# Patient Record
Sex: Male | Born: 1965 | Race: White | Hispanic: No | Marital: Married | Smoking: Current every day smoker
Health system: Southern US, Community
[De-identification: ages and names within clinical notes are randomized; demographics above are authoritative.]

## PROBLEM LIST (undated history)

## (undated) DIAGNOSIS — E119 Type 2 diabetes mellitus without complications: Secondary | ICD-10-CM

## (undated) DIAGNOSIS — I10 Essential (primary) hypertension: Secondary | ICD-10-CM

## (undated) DIAGNOSIS — E785 Hyperlipidemia, unspecified: Secondary | ICD-10-CM

## (undated) DIAGNOSIS — E039 Hypothyroidism, unspecified: Secondary | ICD-10-CM

## (undated) HISTORY — PX: COMBINED KIDNEY-PANCREAS TRANSPLANT: SHX1382

## (undated) NOTE — Telephone Encounter (Signed)
Formatting of this note might be different from the original.  She is calling to get records on this pt  Electronically signed by Townsend Roger at 05/15/2020  1:18 PM EDT

## (undated) NOTE — Progress Notes (Signed)
Formatting of this note is different from the original.    Assessment and Plan  1. Long-term drug therapy    2. History of renal transplant    3. Pancreas transplant status    4. Mixed hyperlipidemia    5. Kidney replaced by transplant    6. Kidney transplant status    7. Long-term current use of drug therapy    1. Status post kidney transplant with excellent allograft function, although we have not had labs since the end of November.  I encouraged him to do his labs as soon as possible.  2. Long-term immunosuppression.  We need drug levels.  3. Status post pancreas transplant for diabetes mellitus.  He had been doing well.  4. Hypertension, well controlled.  5. Renal osteodystrophy, well controlled.  6. Proteinuria, needs to be evaluated.  7. The patient does not have any current depressive symptoms.  He is looking forward to the move to [place], and he states that he has good support of friends and his boss and he is moving closer to them, and he overall states that he feels well where he is at now despite being sad about the divorce.    No orders of the defined types were placed in this encounter.    Return in 4 months (on 03/11/2017).      History of Present Illness:  Chief Concern:  Status post kidney transplant followed by a pancreas transplant.    Subjective:  Overall, the patient is doing well.  He denies any fevers, chills, or night sweats.  No orthopnea, no PND, no nausea, no emesis, no dysuria, no hematuria, and no admissions to the hospital.  He has not had blood work since November.  He is currently in the process of getting divorced.  We had an extensive discussion about managing his health during the stressful moments.  The patient will do his blood work.  He is also in the middle of planning a move to [place], [place].  He may be targeting a Dr. Rufina Falco for his nephrologist there locally.  There is no transplant center.  The closest one is likely to be [place].  He denies any other concerns.   He states that he has been taking his medications.  He has lost some weight.  I have explained to him that absorption of Prograf is dependent and fluctuates with his diet, so we need to check his labs.    Medications, labs, and vitals were reviewed and updated.    The following portions of the patient's chart were reviewed in this encounter and updated as appropriate: Allergies  Meds  Problems        Review of Systems    Constitutional: Negative for chills and fever.   Respiratory: Negative for cough and shortness of breath.    Cardiovascular: Negative for chest pain, palpitations and leg swelling.   Gastrointestinal: Negative for abdominal pain, nausea and vomiting.   Genitourinary: Negative for dysuria, frequency, hematuria and urgency.     Physical Exam  BP 124/82   Wt 190 lb (86.2 kg)   BMI 25.77 kg/m     Vitals reviewed.  Constitutional: He is oriented to person, place, and time. No distress.   HENT:   Head: Normocephalic and atraumatic.   Mouth/Throat: Oropharynx is clear and moist.   Eyes: Conjunctivae are normal. Pupils are equal, round, and reactive to light. No scleral icterus.   Neck: Normal range of motion. Neck supple. No thyromegaly present.  Cardiovascular: Normal rate, regular rhythm and normal heart sounds.  He exhibits no edema.   Pulmonary/Chest: Effort normal and breath sounds normal. No respiratory distress.   Abdominal: Soft. Bowel sounds are normal. He exhibits no mass. There is no tenderness.   Neurological: He is alert and oriented to person, place, and time.   Skin: Skin is warm and dry.         Laverna Peace, MD    SS/dg    D:  11/11/2016  T:  11/13/2016      Electronically signed by Rosilyn Mings at 11/14/2016  1:55 PM EST

---

## 2010-07-18 NOTE — Discharge Summary (Signed)
SURGICAL DISCHARGE SUMMARY  Admission Date: 06/25/2010     Attending Physician: No att. 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 He also had removal of his peritoneal dialysis catheter at that time.  Kerry Mercado had an uneventful post-operative course with excellent function in the renal transplant, stable hemoglobin, prompt resolution of his post-operative ileus and satisfactory cardiopulmonary function.  He did complain of paresthesias in both hands on post-operative day 1 but the paresthesias improved steadily and were gone by the time of his hospital discharge.  Kerry Mercado received simulect induction immunosuppression and then a combination of tacrolimus, cellcept and prednisone for maintenance immunosuppression.  He agreed to participate in a drug study of extended release tacrolimus and this was administered via Pharmacy.  His tacrolimus levels were initially quite high, the dose was reduced and he achieved therapeutic tacrolimus levels by the time of hospital discharge.   Kerry Mercado's diabetes was controlled with an insulin pump prior to his transplant.  The pump was removed for the first few days post-operatively and his blood sugars were managed with lantus insulin and regular insulin sliding scale.  [name] did have high blood sugars for several days but blood sugars improved significantly with modification of his insulin regimen.  Kerry Mercado began using his insulin  pump again one day prior to hospital discharge.  Kerry Mercado was carefully instructed in his medications, his home regimen including accucheks and vital sign measurement and his anticipated blood testing and clinic visit schedule.  Kerry Mercado and his Kerry Mercado were both CMV negative and he received valtrex as CMV prophylaxis.     Discharged Condition:   Doing well postoperatively.   Pain is well controlled.  He is ambulating well. He is tolerating a normal diet.     Disposition: Home    Patient Instructions:   Activity:  activity as tolerated  Assistive Devices:  None  Diet:  Diabetic Diet  Wound Care: As directed  Follow-up: Transplant Office on 07/03/2010  Outpatient Tests:    Labs: CBC w/diff, renal function panel, tacrolimus level in 3 days.    Discharge Medications:  Discharge Medication List as of 06/30/10 02:31 PM      START taking these medications       INV PROGRAF/Placebo    1 Cap, Oral, EVERY 12 HOURS Starting 06/30/2010, Until Discontinued, Normal, Disp-1 Each, R-0        INV TACRO/PLACEBO    3 Tab, Oral, EVERY 24 HOURS Starting 06/30/2010, Until Discontinued, Phone In, Disp-1 Each, R-0        mycophenolate 250 MG PO CAPS    1,000 mg, Oral, 2 TIMES DAILY Starting 06/30/2010, Until Discontinued, Phone In, Disp-1 Cap, R-0        predniSONE 20 MG PO TABS    Use as directed. 40 mg, Oral, DAILY Starting 06/30/2010, Until Discontinued, Phone In, Disp-1 Tab, R-0        sulfamethoxazole-trimethoprim (BACTRIM) 400-80 MG PO TABS    1 Tab, Oral,  EVERY MO, WE & FR Starting 06/30/2010, Last dose on Tue 07/10/10, Phone In, Disp-20 Tab, R-0        aspirin 81 MG PO CHEW    81 mg, Oral, EVERY MORNING Starting 06/30/2010, Until Discontinued, OTC        valACYclovir (VALTREX) 500 MG PO TABS    500 mg, Oral, 2 TIMES DAILY Starting 06/30/2010, Last dose on Sat 07/07/10, Phone In, Disp-14 Tab, R-0        omeprazole (PRILOSEC) 40 MG PO CAP-DEL-REL    40 mg, Oral, DAILY Starting 06/30/2010, Until Discontinued, Phone In, Disp-90 Cap, R-0         clotrimazole (MYCELEX) 10 MG MT TROC    10 mg, Oral, 3 TIMES DAILY AFTER MEALS Starting 06/30/2010, Until Discontinued, Phone In, Disp-1 Troche, R-0        docusate sodium (COLACE) 100 MG PO CAPS    100 mg, Oral, 2 TIMES DAILY Starting 06/30/2010, Until Discontinued, Phone In, Disp-1 Cap, R-0        amLODIPine 10 MG PO TABS    10 mg, Oral, DAILY Starting 06/30/2010, Until Discontinued, Phone In, Disp-90 Tab, R-0        hydrocodone-acetaminophen (NORCO) 5-325 MG PO TABS    1-2 Tab, Oral, EVERY 4 HOURS PRN Starting 06/30/2010, Until Discontinued, Normal, Disp-1 Tab, R-0        insulin lispro 100 UNIT/ML SC SOLN    Use as directed Subcutaneous, Sample, Disp-1 Cartridge, R-0          CONTINUE these medications which have CHANGED       atorvastatin 80 MG PO TABS    80 mg, Oral, NIGHTLY Starting 06/30/2010, Until Discontinued, Phone In, Disp-90 Tab, R-0        ezetimibe (ZETIA) 10 MG PO TABS    10 mg, Oral, EVERY EVENING Starting 06/30/2010, Until Discontinued, Phone In, Disp-90 Tab, R-0        levothyroxine 75 MCG PO TABS    75 mcg, Oral, EVERY MORNING Starting 06/30/2010, Until Discontinued, Phone In, Disp-90 Tab, R-0        fluoxetine 20 MG PO CAPS    40 mg, Oral, EVERY MORNING Starting 06/30/2010, Until Discontinued, Phone In, Disp-90 Cap, R-0        cloNIDine 0.2 MG PO TABS    0.2 mg, Oral, 3 TIMES DAILY Starting 06/30/2010, Until Discontinued, Phone In, Disp-90 Tab, R-0        hydrALAZINE 50 MG PO TABS    50 mg, Oral, 3 TIMES DAILY Starting 06/30/2010, Until Discontinued, Phone In, Disp-1 Each, R-0          STOP taking these medications       cephALEXin 500 MG PO CAPS    Comments:     Reason for Stopping:         diltiazem CD (TAZTIA XT) 300 MG PO CAP-SR-24HR    Comments:     Reason for Stopping:         calcium acetate 667 MG PO CAPS    Comments:     Reason for Stopping:         Potassium Chloride 10 MEQ PO CAP-CONT-REL    Comments:     Reason for Stopping:         Aspirin 81 MG PO TABS    Comments:     Reason for Stopping:          B Complex-C-Folic Acid (DIALYVITE PO)    Comments:  Reason for Stopping:         famotidine 40 MG PO TABS    Comments:     Reason for Stopping:         furosemide (LASIX) 40 MG PO TABS    Comments:     Reason for Stopping:         ondansetron (ZOFRAN) 4 MG PO TABS    Comments:     Reason for Stopping:                   Signed:  Georgeann Oppenheim, MD,  07/18/2010, 7:54 PM

## 2010-08-17 NOTE — Consults (Signed)
Renal-Hypertension Consult/Initial Hospital Visit  Assessment:  Acute gastroenteritis.  Differential included viral illness, food poisoning, or gastroparesis.  There is no definite sign of peritonitis, but with immunosuppression need to watch closely.  IDDM.  He is on an insulin pump.  I will continue this for now, and have him give boluses based on accuchecks.  Check glucose every 4 hrs for now.  HTN.  Likely exacerbated now due to acute illness, wretching, and withdrawal of clonidine (because of N/V unable to keep it down.)  Immunosuppression, s/p renal transplant.  IS drugs ordered by Renal Surgery.  Will follow levels.    Plan:  Insulin pump.  Accucheck q 4 hrs.  Patient to give boluses based on his SS for now.  Clonidine patch.  Resume oral meds when able.  Consider adding benzodiazepine for N/V if does not improve with Reglan and Zofran.    HPI: Kerry Mercado is a 44 y.o. male who had a renal transplant 7 weeks ago.  He has been doing well since then.  Yesterday afternoon he began feeling weak and tired.  He developed nausea and today started vomiting continuously.  He started diarrhea today also.  He has had some sweats but no fever.  He does not have much abdominal pain, except some strain when he wretches.  He recalls having [employer] yesterday before getting sick, 2 chicken sandwiches and french fries.  There is a history of AM nausea for a year before the transplant, but not much overt gastroparesis or other GI history.  He did have a sore throat yesterday.  Because of the refractory N/V, he is admitted now for IVF and antiemetics.    Kerry Mercado has diabetes, and uses an insulin pump at home.  His pump delivers approximately 1.5 units/hr, and he gives boluses based on accuchecks.  Meds reviewed.    PMH: Past Medical History   Diagnosis Date    HTN (hypertension)     Diabetic      insulin dependent     Diabetic retinopathy     Depression     CRF (chronic renal failure)     CAPD (continuous ambulatory  peritoneal dialysis) status     GERD (gastroesophageal reflux disease)     Hypothyroid     History of renal transplant        Past Surgical History   Procedure Date    Laser surgery of eye     Other surgical history      CAPD insertion    Dialysis fistula creation      left arm (never used)    Kidney transplant 06/25/2010     KIDNEY TRANSPLANT RECIPIENT LIVING DONOR performed by KETEL, BEVERLEY L at [facility]         Medications:    Current hospital medications: 0.9 % sodium chloride solution ;  ondansetron (ZOFRAN) injection 4 mg;  amLODIPine (NORVASC) tablet 10 mg;  aspirin chewable tablet 81 mg;  atorvastatin (LIPITOR) tablet 40 mg;  cloNIDine (CATAPRES) tablet 0.1 mg;  ezetimibe (ZETIA) tablet 10 mg;  fluoxetine (PROZAC) capsule 40 mg;  hydrocodone-acetaminophen (NORCO) 5-325 MG per tablet 1-2 Tab;  levothyroxine (SYNTHROID) tablet 75 mcg  omeprazole (PRILOSEC) capsule 40 mg;  predniSONE (DELTASONE) tablet 5 mg;  sulfamethoxazole-trimethoprim (BACTRIM, SEPTRA) 400-80 MG per tablet 1 Tab;  valACYclovir (VALTREX) tablet 500 mg;  LABETALOL HCL 5 MG/ML IV SOLN;  metoclopramide (REGLAN) injection 10 mg;  mycophenolate (CELLCEPT) 750 mg in dextrose 5 % 140 mL IVPB;  AMINOPHYLLINE  25 MG/ML IV SOLN    Allergies: Shellfish allergy    FH: family history is not on file.    SH:  reports that he has never smoked. His smokeless tobacco use includes chew.  He reports that he drinks alcohol.  He reports that he uses illicit drugs (Marijuana) about twice per week.    ROS: Review of Systems -   History obtained from the patient  General ROS: negative  Respiratory ROS: no cough, shortness of breath, or wheezing  Cardiovascular ROS: no chest pain or dyspnea on exertion  Gastrointestinal ROS: see HPI  Genito-Urinary ROS: no dysuria, trouble voiding, or hematuria  Musculoskeletal ROS: negative       Physical Examination:  BP 212/0  Pulse 95  Temp 97.3 F (36.3 C)  Resp 32  Ht 6' (1.829 m)  Wt 159 lb 9 oz (72.377 kg)   SpO2 100%    General appearance: alert, moderate distress, cooperative, appears stated age  Head: Normocephalic, without obvious abnormality, atraumatic  Eyes: conjunctivae/corneas clear. Pupils equal, round, reactive to light. Extraocular Movements intact.  Throat: Lips, mucosa, and tongue normal. Teeth and gums normal  Neck: supple, symmetrical, trachea midline, no adenopathy, thyroid: not enlarged, symmetric, no tenderness/mass/nodules and no Jugular Venous Distention  Lungs: clear to auscultation bilaterally  Heart: regular rate and rhythm, S1, S2 normal, no rub  Abdomen: BS present.  Soft.  No definite tenderness.  He does wretch repeatedly during the exam.  Extremities: extremities normal, atraumatic, no cyanosis or edema  Pulses: 2+ and symmetric  Skin: Skin color, texture, turgor normal. No rashes or lesions  Neurologic:   Alert and oriented X 3.  Normal strength and tone.  Grossly normal      Labs: Lab Results   Component Value Date    WBC 11.5 08/17/10    HEMOGLOBIN 14.3 08/17/10    HEMATOCRIT 42.2 08/17/10    PLATELETCNT 237 08/17/10    MCV 92.5 08/17/10       Lab Results   Component Value Date    SODIUM 144 08/17/10    POTASSIUM 4.4 08/17/10    CHLORIDE 104 08/17/10    CO2VEN 26 08/17/10    GLUCOSE 282 08/17/10    BUN 22 08/17/10    CREATININE 1.38 08/17/10    CALCIUM 10.1 08/17/10    MAGNESIUM 1.4 08/17/10    PHOSPHORUS 3.1 08/17/10    ALBUMIN 4.7 08/17/10       Lab Results   Component Value Date    MAGNESIUM 1.4 08/17/10           Cleatrice Burke, MD  08/17/2010, 3:16 PM

## 2010-08-18 NOTE — Progress Notes (Signed)
Renal Progress Note    Assessment:   Acute gastroenteritis. Differential included viral illness, food poisoning, or gastroparesis. Much better today; back to normal.  I suspect fluids and glucose control have helped.  IDDM. He is on an insulin pump. Would continue at increased basal rate for now.  HTN. Improved.  Will resume home meds, follow bp's.  Immunosuppression, s/p renal transplant. IS drugs ordered by Renal Surgery. Will follow levels.    Subjective:  Feels much better.  Wants to eat.    Medications reviewed.    Allergies: Shellfish allergy    Physical Examination:  BP 168/68  Pulse 93  Temp(Src) 98.3 F (36.8 C) (Oral)  Resp 18  Ht 6' (1.829 m)  Wt 154 lb 14.4 oz (70.262 kg)  SpO2 97%    Intake/Output Summary (Last 24 hours) at 08/18/10 1156  Last data filed at 08/18/10 2130   Gross per 24 hour   Intake 3432.92 ml   Output   3350 ml   Net  82.92 ml       General appearance: alert, no distress, cooperative, appears stated age  Eyes: negative  Lungs: clear to auscultation bilaterally  Heart: regular rate and rhythm, S1, S2 normal  Abdomen: soft, non-tender. Bowel sounds normal. No masses,  no organomegaly  Extremities: extremities normal, atraumatic, no cyanosis or edema  Neurologic:   Alert and oriented X 3.  Grossly normal        Labs: Lab Results   Component Value Date    WBC 9.0 08/18/10    HEMOGLOBIN 12.7 08/18/10    HEMATOCRIT 37.3 08/18/10    PLATELETCNT 225 08/18/10    MCV 91.6 08/18/10       Lab Results   Component Value Date    SODIUM 142 08/18/10    POTASSIUM 4.3 08/18/10    CHLORIDE 109 08/18/10    CO2VEN 22 08/18/10    GLUCOSE 193 08/18/10    BUN 15 08/18/10    CREATININE 1.10 08/18/10    CALCIUM 9.4 08/18/10    MAGNESIUM 1.4 08/17/10    PHOSPHORUS 3.1 08/18/10    ALBUMIN 3.9 08/18/10       Lab Results   Component Value Date    MAGNESIUM 1.4 08/17/10           Cleatrice Burke, MD  08/18/2010, 11:56 AM

## 2010-08-19 NOTE — Unmapped External Note (Signed)
Problem: General Plan of Care - Adult/OB  Goal: PLAN OF CARE REVIEWED  Plan of Care mutually reviewed/revised with the patient and/or other representative during this shift or as per policy.   Outcome: Ongoing (see interventions/notes)  Plan of Care reviewed with: patient    Patient Progress: No change    Outcome Summary: Pt slept between cares. No complaints voiced.

## 2010-08-19 NOTE — Progress Notes (Signed)
Renal Surgery Follow Up Note      Assessment:   1) Resolved episode of nausea, vomiting   2) hydration status much better   3) blood pressure control improved   4) diabetes adequately controlled   5) good renal transplant function    Plan:   1) discharge home   2) follow-up appointment (previously scheduled) on 08/21/2010       Subjective:  Kerry Mercado is back to normal.  No problems with po intake yesterday.    Objective:  Sitting up in bed, comfortable    General: BP 146/86  Pulse 86  Temp(Src) 98.1 F (36.7 C) (Oral)  Resp 16  Ht 6' (1.829 m)  Wt 158 lb 11.2 oz (71.986 kg)  SpO2 98%   Intake/Output Summary (Last 24 hours) at 08/19/10 0911  Last data filed at 08/19/10 0824   Gross per 24 hour   Intake 4198.33 ml   Output   3575 ml   Net 623.33 ml     HEENT:  Mouth moist, no thrush  CV: Regular rate and rhythm, no murmurs  Lungs: clear to auscultation.  Abdomen: Non-distended, bowel sounds present. The abdomen was soft, non-tender, and without masses, or hepatosplenomegaly.  Renal transplant in RLQ non-tender  Extremities: No  lower extremity edema.  Integument: No skin rashes, or palpable nodules.    Labs: Lab Results   Component Value Date    WBC 10.4 08/19/10    HEMOGLOBIN 12.5 08/19/10    HEMATOCRIT 37.2 08/19/10    PLATELETCNT 206 08/19/10    MCV 91.2 08/19/10         Lab Results   Component Value Date    SODIUM 141 08/19/10    POTASSIUM 4.0 08/19/10    CHLORIDE 108 08/19/10    CO2VEN 24 08/19/10    GLUCOSE 125 08/19/10    BUN 12 08/19/10    CREATININE 1.08 08/19/10    CALCIUM 9.3 08/19/10    MAGNESIUM 1.4 08/17/10    PHOSPHORUS 2.6 08/19/10    ALBUMIN 3.6 08/19/10         Lab Results   Component Value Date    CALCIUM 9.3 08/19/10    PHOSPHORUS 2.6 08/19/10         Lab Results   Component Value Date    MAGNESIUM 1.4 08/17/10             Georgeann Oppenheim, MD  08/19/2010, 9:11 AM

## 2010-08-19 NOTE — Discharge Summary (Signed)
SURGICAL DISCHARGE SUMMARY  Admission Date: 08/17/2010     Attending Physician: Georgeann Oppenheim, MD    Discharge Date:  Primary Care Physician: PAGE Marylen Ponto, MD     Discharge Diagnoses: Patient Active Hospital Problem List:  *Nausea and vomiting in adult, intractable (08/18/2010)  Kidney transplant (06/25/2010)  Diabetes mellitus (06/25/2010)  Hypertension (06/25/2010)  Immunosuppression (07/18/2010)    Summary of History and Hospital Course:  Kerry Mercado is a 44 year old kidney transplant recipient who developed intractable nausea, vomiting with inability to take his oral medications for one day and was admitted for treatment.  He was very hypertensive and hyperglycemic at the time of admission.  He received anti-emetics, IV fluids, nitropaste, dermal clonidine and adjustment of his insulin regimen.  He responded well to the treatment and his symptoms resolved quickly.  He tolerated resumption of his diet and oral medications.  His blood pressure was adequately controlled when his usual blood pressure medicines were resumed.  He continued to have good function in his renal transplant during the hospitalization.  The etiology of his GI problems was not fully diagnosed but was felt to be exacerbation of diabetic gastroparesis.     Discharged Condition:   Improved. He is tolerating a normal diet.     Disposition: Home    Patient Instructions:   Activity:  activity as tolerated  Diet:  Diabetic Diet  Follow-up: Transplant Office on 08/21/2010  Outpatient Tests:    Labs: CBC w/diff, renal function panel, prograf level in 2 days.    Discharge Medications:  Current Discharge Medication List      START taking these medications       !! sulfamethoxazole-trimethoprim (BACTRIM) 400-80 MG PO TABS    Take 1 Tab by mouth Every Monday, Wednesday, Friday.    Qty: 13 Tab Refills: 1        !! - Potential duplicate medications found. Please discuss with provider.      CONTINUE these medications which have NOT CHANGED       other  OTHER    1 Tab by Other route 2 times daily. Research medications, see dosing schedule with patient         cloNIDine 0.1 MG PO TABS    Take 0.1 mg by mouth 3 times daily.        predniSONE 5 MG PO TABS    Take 5 mg by mouth daily. Use as directed.         insulin lispro (HUMALOG) 100 UNIT/ML SC SOLN    1-24 Units by Subcutaneous route 3 times daily (after meals). Use as directed         valACYclovir (VALTREX) 500 MG PO TABS    Take 500 mg by mouth 2 times daily.        INV PROGRAF/Placebo    Take 1 Cap by mouth every 12 hours.    Qty: 1 Each Refills: 0    Comments: Drug is Scheduled on Call!  Call Pharmacy ~1 hour prior to next scheduled dose.        INV TACRO/PLACEBO    Take 3 Tabs by mouth every 24 hours.    Qty: 1 Each Refills: 0    Comments: This is on Scheduled on Call!  Call Pharmacy ~1 hour prior to dose.        mycophenolate 250 MG PO CAPS    Take 4 Caps by mouth 2 times daily.    Qty: 1 Cap Refills: 0  aspirin 81 MG PO CHEW    Take 1 Tab by mouth every morning.        omeprazole (PRILOSEC) 40 MG PO CAP-DEL-REL    Take 1 Cap by mouth daily.    Qty: 90 Cap Refills: 0        docusate sodium (COLACE) 100 MG PO CAPS    Take 1 Cap by mouth 2 times daily.    Qty: 1 Cap Refills: 0        atorvastatin 80 MG PO TABS    Take 1 Tab by mouth nightly.    Qty: 90 Tab Refills: 0        ezetimibe (ZETIA) 10 MG PO TABS    Take 1 Tab by mouth every evening.    Qty: 90 Tab Refills: 0        levothyroxine 75 MCG PO TABS    Take 1 Tab by mouth every morning.    Qty: 90 Tab Refills: 0        fluoxetine 20 MG PO CAPS    Take 2 Caps by mouth every morning.    Qty: 90 Cap Refills: 0        amLODIPine 10 MG PO TABS    Take 1 Tab by mouth daily.    Qty: 90 Tab Refills: 0        !! sulfamethoxazole-trimethoprim (BACTRIM) 400-80 MG PO TABS    Take 1 Tab by mouth Every Monday, Wednesday, Friday for 10 days.    Qty: 20 Tab Refills: 0        !! - Potential duplicate medications found. Please discuss with provider.      STOP taking  these medications       hydrocodone-acetaminophen (NORCO) 5-325 MG PO TABS    Comments:     Reason for Stopping:         clotrimazole (MYCELEX) 10 MG MT TROC    Comments:     Reason for Stopping:         hydrALAZINE 50 MG PO TABS    Comments:     Reason for Stopping:                   Signed:  Georgeann Oppenheim, MD,  08/19/2010, 9:58 AM

## 2011-04-09 NOTE — Progress Notes (Signed)
Patient seen with resident and examined. I formulated assessment and plan.

## 2011-04-09 NOTE — Progress Notes (Signed)
Patient seen with resident and examined. I formulated assessment and plan.     Renal function stable. Blood sugars fair. Blood pressure good. Fever resolved. ? From Thymoglobulin. No new recommendations.

## 2011-04-10 NOTE — Progress Notes (Signed)
Patient seen with resident and examined. I formulated the assessment and plan. Both kidney and pancreas functioning well. Fever resolved. All cultures negative. Blood pressure good.

## 2011-07-27 NOTE — Progress Notes (Signed)
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 BS improved         2- HTN with orthostatic symptoms: Moderate elevations with the nausea and emesis  3- N/E - similar to prior thymo dosing:  Ativan was working        Subjective: pt having difficult to control nausea still having emesis this am.  Objective:    General: BP 160/84  Pulse 86  Temp(Src) 98.7 F (37.1 C) (Oral)  Resp 18  Ht 6' (1.829 m)  Wt 180 lb 12.8 oz (82.01 kg)  BMI 24.52 kg/m2  SpO2 99%     Intake/Output Summary (Last 24 hours) at 07/27/11 0854  Last data filed at 07/27/11 0530   Gross per 24 hour   Intake 2831.67 ml   Output   1600 ml   Net 1231.67 ml     HEENT: Sclerae anicteric, conjunctivae normal.  CV: Regular rate and rhythm, no gallops, murmurs, or rub; no JVD noted.  Lungs: clear to auscultation.  Abdomen: Non-distended, bowel sounds normal. The abdomen was soft and without masses, or hepatosplenomegaly. Tender around the biopsy site.   Extremities: No calf tenderness, or lower extremity edema.  Integument: No skin rashes, or palpable nodules.  Neuropsychiatric: Awake and alert; oriented to person, time, and place.    Labs:  Lab Results   Component Value Date    WBC 5.4 07/27/2011    HEMOGLOBIN 14.4 07/27/2011    HEMATOCRIT 43.6 07/27/2011    PLATELETCNT 132* 07/27/2011    MCV 85.8 07/27/2011     Lab Results   Component Value Date    SODIUM 142 07/27/2011    POTASSIUM 4.0 07/27/2011    CHLORIDE 107 07/27/2011    CO2VEN 23 07/27/2011    GLUCOSE 79 07/27/2011    BUN 17 07/27/2011    CREATININE 1.09 07/27/2011    CALCIUM 8.8 07/27/2011    MAGNESIUM 1.6 07/27/2011    PHOSPHORUS 2.7 07/27/2011    ALBUMIN 2.9* 07/27/2011     Lab Results   Component Value Date    CALCIUM 8.8 07/27/2011    PHOSPHORUS 2.7 07/27/2011     Lab Results   Component Value Date    MAGNESIUM 1.6 07/27/2011       SAMER B SADER, MD  07/27/2011, 8:54  AM

## 2011-07-28 NOTE — Progress Notes (Signed)
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 BS improved           2- HTN with orthostatic symptoms: improved   3- N/E - similar to prior thymo dosing:  Improved  4- Ok to dc if tolerating po.        Subjective: pt having feeling better this am decreased nausea     Objective:    General: BP 142/85  Pulse 114  Temp(Src) 98.6 F (37 C) (Oral)  Resp 18  Ht 6' (1.829 m)  Wt 179 lb 14.3 oz (81.6 kg)  BMI 24.40 kg/m2  SpO2 97%     Intake/Output Summary (Last 24 hours) at 07/28/11 1044  Last data filed at 07/28/11 0500   Gross per 24 hour   Intake 3397.5 ml   Output   1775 ml   Net 1622.5 ml     HEENT: Sclerae anicteric, conjunctivae normal.  CV: Regular rate and rhythm, no gallops, murmurs, or rub; no JVD noted.  Lungs: clear to auscultation.  Abdomen: Non-distended, bowel sounds normal. The abdomen was soft and without masses, or hepatosplenomegaly. Tender around the biopsy site.   Extremities: No calf tenderness, or lower extremity edema.  Integument: No skin rashes, or palpable nodules.  Neuropsychiatric: Awake and alert; oriented to person, time, and place.    Labs:  Lab Results   Component Value Date    WBC 7.6 07/28/2011    HEMOGLOBIN 14.6 07/28/2011    HEMATOCRIT 43.3 07/28/2011    PLATELETCNT 129* 07/28/2011    MCV 83.9 07/28/2011     Lab Results   Component Value Date    SODIUM 137 07/28/2011    POTASSIUM 3.6 07/28/2011    CHLORIDE 104 07/28/2011    CO2VEN 20* 07/28/2011    GLUCOSE 107* 07/28/2011    BUN 17 07/28/2011    CREATININE 1.18 07/28/2011    CALCIUM 8.7 07/28/2011    MAGNESIUM 1.6 07/27/2011    PHOSPHORUS 3.4 07/28/2011    ALBUMIN 2.9* 07/28/2011     Lab Results   Component Value Date    CALCIUM 8.7 07/28/2011    PHOSPHORUS 3.4 07/28/2011     Lab Results   Component Value Date    MAGNESIUM 1.6 07/27/2011       SAMER B SADER, MD  07/28/2011, 10:44 AM

## 2011-07-29 NOTE — Progress Notes (Signed)
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 Ok to dc   2- HTN with orthostatic symptoms: no change in BP meds.  3- N/E - resolved  4- Ok to dc if tolerating po.        Subjective: pt having feeling better this am no nausea     Objective:    General: BP 166/88  Pulse 69  Temp(Src) 98.3 F (36.8 C) (Oral)  Resp 16  Ht 6' (1.829 m)  Wt 180 lb 5.4 oz (81.8 kg)  BMI 24.46 kg/m2  SpO2 98%     Intake/Output Summary (Last 24 hours) at 07/29/11 1355  Last data filed at 07/29/11 0500   Gross per 24 hour   Intake   3200 ml   Output   1300 ml   Net   1900 ml     HEENT: Sclerae anicteric, conjunctivae normal.  CV: Regular rate and rhythm, no gallops, murmurs, or rub; no JVD noted.  Lungs: clear to auscultation.  Abdomen: Non-distended, bowel sounds normal. The abdomen was soft and without masses, or hepatosplenomegaly. Tender around the biopsy site.   Extremities: No calf tenderness, or lower extremity edema.  Integument: No skin rashes, or palpable nodules.  Neuropsychiatric: Awake and alert; oriented to person, time, and place.    Labs:  Lab Results   Component Value Date    WBC 3.6* 07/29/2011    HEMOGLOBIN 12.9* 07/29/2011    HEMATOCRIT 38.0 07/29/2011    PLATELETCNT 116* 07/29/2011    MCV 83.2 07/29/2011     Lab Results   Component Value Date    SODIUM 140 07/29/2011    POTASSIUM 3.7 07/29/2011    CHLORIDE 111* 07/29/2011    CO2VEN 22 07/29/2011    GLUCOSE 92 07/29/2011    BUN 12 07/29/2011    CREATININE 1.05 07/29/2011    CALCIUM 8.2* 07/29/2011    MAGNESIUM 1.6 07/27/2011    PHOSPHORUS 2.6 07/29/2011    ALBUMIN 2.3* 07/29/2011     Lab Results   Component Value Date    CALCIUM 8.2* 07/29/2011    PHOSPHORUS 2.6 07/29/2011     Lab Results   Component Value Date    MAGNESIUM 1.6 07/27/2011       SAMER B SADER, MD  07/29/2011, 1:55 PM

## 2013-11-22 ENCOUNTER — Encounter (HOSPITAL_COMMUNITY): Payer: Self-pay | Admitting: Emergency Medicine

## 2013-11-22 ENCOUNTER — Emergency Department (INDEPENDENT_AMBULATORY_CARE_PROVIDER_SITE_OTHER): Payer: 59

## 2013-11-22 ENCOUNTER — Emergency Department (HOSPITAL_COMMUNITY)
Admission: EM | Admit: 2013-11-22 | Discharge: 2013-11-22 | Disposition: A | Discharge status: Home / Self Care | Payer: 59 | Source: Home / Self Care

## 2013-11-22 DIAGNOSIS — S20219A Contusion of unspecified front wall of thorax, initial encounter: Secondary | ICD-10-CM

## 2013-11-22 DIAGNOSIS — S20212A Contusion of left front wall of thorax, initial encounter: Secondary | ICD-10-CM

## 2013-11-22 LAB — POCT I-STAT, CHEM 8
BUN: 19 mg/dL (ref 6–23)
CREATININE: 1.6 mg/dL — AB (ref 0.50–1.35)
Calcium, Ion: 1.29 mmol/L — ABNORMAL HIGH (ref 1.12–1.23)
Chloride: 103 mEq/L (ref 96–112)
Glucose, Bld: 99 mg/dL (ref 70–99)
HEMATOCRIT: 56 % — AB (ref 39.0–52.0)
Hemoglobin: 19 g/dL — ABNORMAL HIGH (ref 13.0–17.0)
POTASSIUM: 4.7 meq/L (ref 3.7–5.3)
SODIUM: 141 meq/L (ref 137–147)
TCO2: 26 mmol/L (ref 0–100)

## 2013-11-22 MED ORDER — HYDROCODONE-ACETAMINOPHEN 5-325 MG PO TABS: 1.0000 | ORAL_TABLET | ORAL | Status: AC | PRN

## 2013-11-22 NOTE — Discharge Instructions (Signed)
Chest Contusion °A chest contusion is a deep bruise on your chest area. Contusions are the result of an injury that caused bleeding under the skin. A chest contusion may involve bruising of the skin, muscles, or ribs. The contusion may turn blue, purple, or yellow. Minor injuries will give you a painless contusion, but more severe contusions may stay painful and swollen for a few weeks. °CAUSES  °A contusion is usually caused by a blow, trauma, or direct force to an area of the body. °SYMPTOMS  °· Swelling and redness of the injured area. °· Discoloration of the injured area. °· Tenderness and soreness of the injured area. °· Pain. °DIAGNOSIS  °The diagnosis can be made by taking a history and performing a physical exam. An X-ray, CT scan, or MRI may be needed to determine if there were any associated injuries, such as broken bones (fractures) or internal injuries. °TREATMENT  °Often, the best treatment for a chest contusion is resting, icing, and applying cold compresses to the injured area. Deep breathing exercises may be recommended to reduce the risk of pneumonia. Over-the-counter medicines may also be recommended for pain control. °HOME CARE INSTRUCTIONS  °· Put ice on the injured area. °· Put ice in a plastic bag. °· Place a towel between your skin and the bag. °· Leave the ice on for 15-20 minutes, 03-04 times a day. °· Only take over-the-counter or prescription medicines as directed by your caregiver. Your caregiver may recommend avoiding anti-inflammatory medicines (aspirin, ibuprofen, and naproxen) for 48 hours because these medicines may increase bruising. °· Rest the injured area. °· Perform deep-breathing exercises as directed by your caregiver. °· Stop smoking if you smoke. °· Do not lift objects over 5 pounds (2.3 kg) for 3 days or longer if recommended by your caregiver. °SEEK IMMEDIATE MEDICAL CARE IF:  °· You have increased bruising or swelling. °· You have pain that is getting worse. °· You have  difficulty breathing. °· You have dizziness, weakness, or fainting. °· You have blood in your urine or stool. °· You cough up or vomit blood. °· Your swelling or pain is not relieved with medicines. °MAKE SURE YOU:  °· Understand these instructions. °· Will watch your condition. °· Will get help right away if you are not doing well or get worse. °Document Released: 06/11/2001 Document Revised: 06/10/2012 Document Reviewed: 03/09/2012 °ExitCare® Patient Information ©2014 [employer]. ° °Rib Contusion °A rib contusion (bruise) can occur by a blow to the chest or by a fall against a hard object. Usually these will be much better in a couple weeks. If X-rays were taken today and there are no broken bones (fractures), the diagnosis of bruising is made. However, broken ribs may not show up for several days, or may be discovered later on a routine X-ray when signs of healing show up. If this happens to you, it does not mean that something was missed on the X-ray, but simply that it did not show up on the first X-rays. Earlier diagnosis will not usually change the treatment. °HOME CARE INSTRUCTIONS  °· Avoid strenuous activity. Be careful during activities and avoid bumping the injured ribs. Activities that pull on the injured ribs and cause pain should be avoided, if possible. °· For the first day or two, an ice pack used every 20 minutes while awake may be helpful. Put ice in a plastic bag and put a towel between the bag and the skin. °· Eat a normal, well-balanced diet. Drink plenty of   fluids to avoid constipation. °· Take deep breaths several times a day to keep lungs free of infection. Try to cough several times a day. Splint the injured area with a pillow while coughing to ease pain. Coughing can help prevent pneumonia. °· Wear a rib belt or binder only if told to do so by your caregiver. If you are wearing a rib belt or binder, you must do the breathing exercises as directed by your caregiver. If not used properly,  rib belts or binders restrict breathing which can lead to pneumonia. °· Only take over-the-counter or prescription medicines for pain, discomfort, or fever as directed by your caregiver. °SEEK MEDICAL CARE IF:  °· You or your child has an oral temperature above 102° F (38.9° C). °· Your baby is older than 3 months with a rectal temperature of 100.5° F (38.1° C) or higher for more than 1 day. °· You develop a cough, with thick or bloody sputum. °SEEK IMMEDIATE MEDICAL CARE IF:  °· You have difficulty breathing. °· You feel sick to your stomach (nausea), have vomiting or belly (abdominal) pain. °· You have worsening pain, not controlled with medications, or there is a change in the location of the pain. °· You develop sweating or radiation of the pain into the arms, jaw or shoulders, or become light headed or faint. °· You or your child has an oral temperature above 102° F (38.9° C), not controlled by medicine. °· Your or your baby is older than 3 months with a rectal temperature of 102° F (38.9° C) or higher. °· Your baby is 3 months old or younger with a rectal temperature of 100.4° F (38° C) or higher. °MAKE SURE YOU:  °· Understand these instructions. °· Will watch your condition. °· Will get help right away if you are not doing well or get worse. °Document Released: 06/11/2001 Document Revised: 01/11/2013 Document Reviewed: 05/04/2008 °ExitCare® Patient Information ©2014 [employer]. ° °

## 2013-11-22 NOTE — ED Provider Notes (Signed)
CSN: [id number]     Arrival date & time 11/22/13  1007 History   First MD Initiated Contact with Patient 11/22/13 1117     Chief Complaint  Patient presents with  . Flank Pain     (Consider location/radiation/quality/duration/timing/severity/associated sxs/prior Treatment) HPI Comments: History as above. Patient is complaining of progressive pain to the left lateral and posterior chest wall after falling on the shovel last week. He locates the pain from anterior axillary line to the lateral scapular line the upper chest. He states he has a cough he also has copious amount of PND. He denies shortness of breath. States he smokes daily quarter of a pack. 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   History reviewed. No pertinent past medical history. Past Surgical History  Procedure Laterality Date  . Combined kidney-pancreas transplant     History reviewed. No pertinent family history. History  Substance Use Topics  . Smoking status: Current Every Day Smoker  . Smokeless tobacco: Not on file  . Alcohol Use: Yes    Review of Systems  Constitutional: Negative for fever, activity change and fatigue.  HENT: Positive for postnasal drip and rhinorrhea.   Respiratory: Positive for cough. Negative for shortness of breath and wheezing.        Cough is chronic.  Cardiovascular: Negative for leg swelling.       As per history of present illness. No heaviness, tightness, pressure or fullness in the anterior chest.  Gastrointestinal: Negative.  Negative for nausea, vomiting, abdominal pain and abdominal distention.  Genitourinary: Negative.   Musculoskeletal: Negative for arthralgias and neck pain.  Skin:       Mild ecchymosis in the left posterior lateral chest wall.  Neurological: Negative.       Allergies  Shellfish allergy  Home Medications   Current Outpatient Rx  Name  Route  Sig  Dispense  Refill  . aspirin 81 MG tablet   Oral   Take 81 mg by  mouth daily.         Marland Kitchen. atorvastatin (LIPITOR) 20 MG tablet   Oral   Take 20 mg by mouth daily.         Marland Kitchen. ezetimibe (ZETIA) 10 MG tablet   Oral   Take 10 mg by mouth at bedtime.         Marland Kitchen. levothyroxine (SYNTHROID, LEVOTHROID) 75 MCG tablet   Oral   Take 75 mcg by mouth daily before breakfast.         . metoCLOPramide (REGLAN) 5 MG tablet   Oral   Take 5 mg by mouth 3 (three) times daily before meals.         . mycophenolate (CELLCEPT) 200 MG/ML suspension   Oral   Take 2,250 mg by mouth 2 (two) times daily.         Marland Kitchen. omeprazole (PRILOSEC) 40 MG capsule   Oral   Take 40 mg by mouth daily.         . prednisoLONE 5 MG TABS tablet   Oral   Take 5 mg by mouth.         . sulfamethoxazole-trimethoprim (BACTRIM DS,SEPTRA DS) 800-160 MG per tablet   Oral   Take 1 tablet by mouth every Monday, Wednesday, and Friday.         . tacrolimus (PROGRAF) 1 MG capsule   Oral   Take 1 mg by mouth 2 (two) times daily.         .Marland Kitchen  valGANciclovir (VALCYTE) 450 MG tablet   Oral   Take by mouth daily.         Marland Kitchen HYDROcodone-acetaminophen (NORCO/VICODIN) 5-325 MG per tablet   Oral   Take 1 tablet by mouth every 4 (four) hours as needed.   15 tablet   0    BP 117/75  Pulse 82  Temp(Src) 98.1 F (36.7 C) (Oral)  Resp 20  SpO2 95% Physical Exam  Nursing note and vitals reviewed. Constitutional: He is oriented to person, place, and time. He appears well-developed and well-nourished. No distress.  HENT:  Head: Normocephalic and atraumatic.  Eyes: Conjunctivae and EOM are normal.  Neck: Normal range of motion. Neck supple.  Cardiovascular: Normal rate, regular rhythm and normal heart sounds.   Pulmonary/Chest: Effort normal. No respiratory distress.  Slight wheeze and crackles in the left lateral chest over the area of ecchymosis.  Abdominal: Soft. Bowel sounds are normal. He exhibits no distension. There is no tenderness. There is no rebound and no guarding.   Vertical midsagittal surgical incision line from the umbilicus to the suprapubic. No bruising or tenderness of the abdomen. No LUQ tenderness, mass or palpable spleen.  Tenderness over the lower left costal margin with light palpation. No deformity.   Musculoskeletal: He exhibits no edema.  Neurological: He is alert and oriented to person, place, and time. He exhibits normal muscle tone.  Skin: Skin is warm and dry.  Multiple tattoos of the arms.  Psychiatric: He has a normal mood and affect.    ED Course  Procedures (including critical care time) Labs Review Labs Reviewed  POCT I-STAT, CHEM 8 - Abnormal; Notable for the following:    Creatinine, Ser 1.60 (*)    Calcium, Ion 1.29 (*)    Hemoglobin 19.0 (*)    HCT 56.0 (*)    All other components within normal limits   Imaging Review Dg Ribs Unilateral W/chest Left  11/22/2013   CLINICAL DATA:  Larey Seat 2 weeks ago no.  Left posterior rib pain.  EXAM: LEFT RIBS AND CHEST - 3+ VIEW  COMPARISON:  None.  FINDINGS: Frontal chest shows no focal airspace consolidation. No pneumothorax or pleural effusion. The cardiopericardial silhouette is within normal limits for size.  Oblique views of the left ribs obtained with a radiopaque BB localizing the area of concern show no underlying displaced left-sided rib fracture.  IMPRESSION: No evidence for displaced acute left rib fracture.   Electronically Signed   By: Kennith Center M.D.   On: 11/22/2013 11:49   Results for orders placed during the hospital encounter of 11/22/13  POCT I-STAT, CHEM 8      Result Value Ref Range   Sodium 141  137 - 147 mEq/L   Potassium 4.7  3.7 - 5.3 mEq/L   Chloride 103  96 - 112 mEq/L   BUN 19  6 - 23 mg/dL   Creatinine, Ser 1.61 (*) 0.50 - 1.35 mg/dL   Glucose, Bld 99  70 - 99 mg/dL   Calcium, Ion 0.96 (*) 1.12 - 1.23 mmol/L   TCO2 26  0 - 100 mmol/L   Hemoglobin 19.0 (*) 13.0 - 17.0 g/dL   HCT 04.5 (*) 40.9 - 81.1 %      MDM   Final diagnoses:  Contusion  of left chest wall  Contusion of rib on left side   Norco as dir #15 Deep breaths frequently, Red flags discussed and seek medical attention promptly.     Hayden Rasmussen, NP  11/22/13 1220 

## 2013-11-22 NOTE — ED Notes (Signed)
Patient reports 1 week ago he fell onto a snow shovel and sustained injury to left flank. Left kidney and pancreas transplant recipient , swelling and ecchymosis present left flank area. Denies other injury

## 2013-11-22 NOTE — ED Notes (Signed)
Discussed medication precautions (constipation, dizziness, no driving)

## 2013-11-23 NOTE — ED Provider Notes (Signed)
Medical screening examination/treatment/procedure(s) were performed by non-physician practitioner and as supervising physician I was immediately available for consultation/collaboration.  Leslee Homeavid Berneta Sconyers, M.D.  Reuben Likesavid C Sharol Croghan, MD 11/23/13 1310

## 2015-02-03 IMAGING — CR DG RIBS W/ CHEST 3+V*L*
3 series · 3 of 3 positions shown · non-contrast
Comparison: None.

CLINICAL DATA: Fell 2 weeks ago no.  Left posterior rib pain.

EXAM:
LEFT RIBS AND CHEST - 3+ VIEW

[view not recorded (1 of 3)]
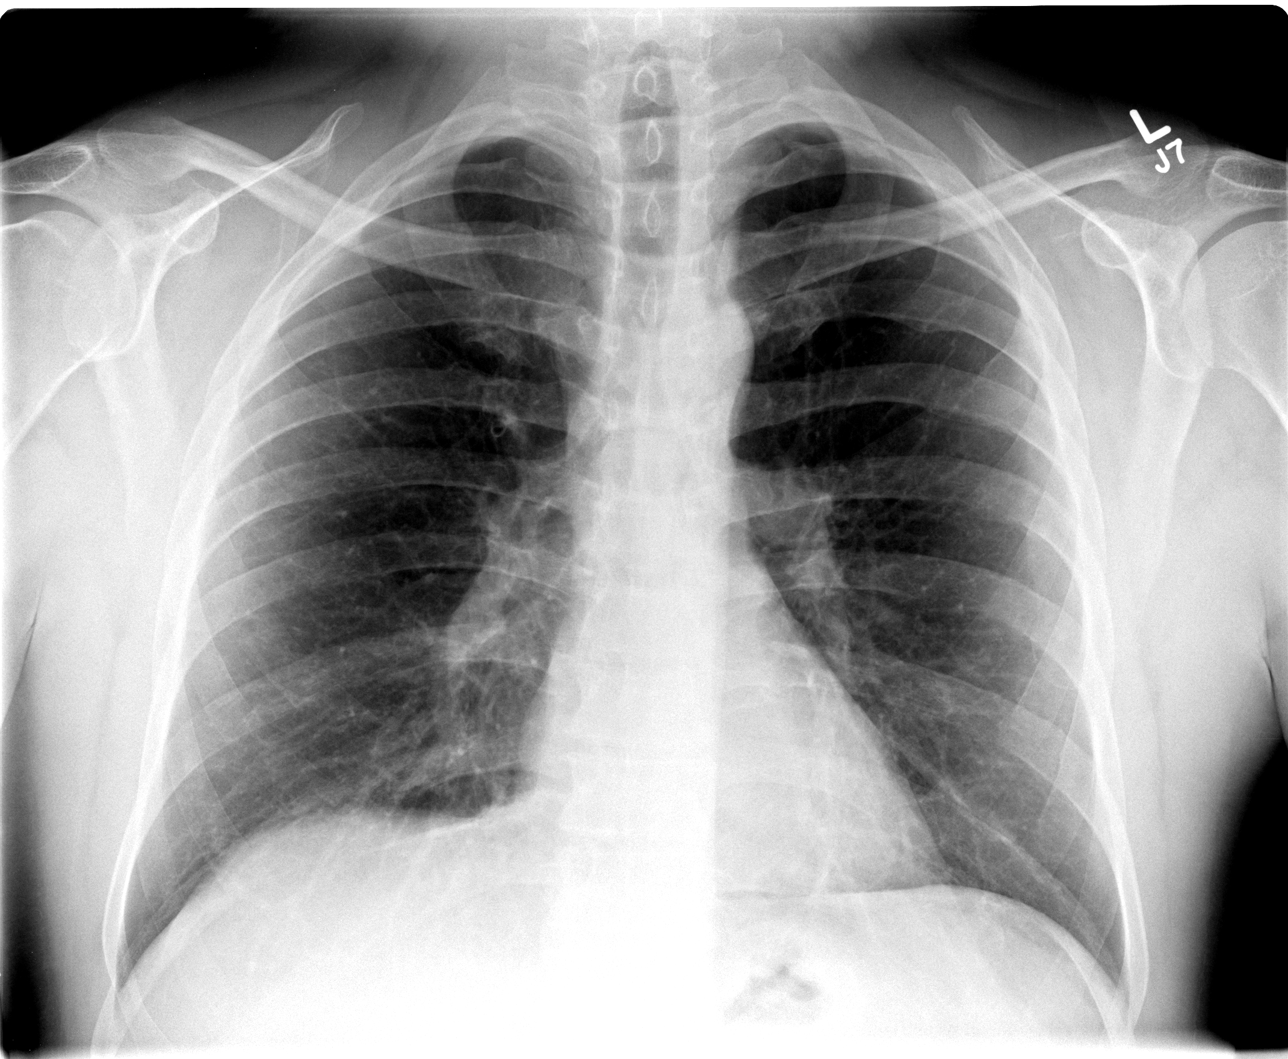

[view not recorded (2 of 3)]
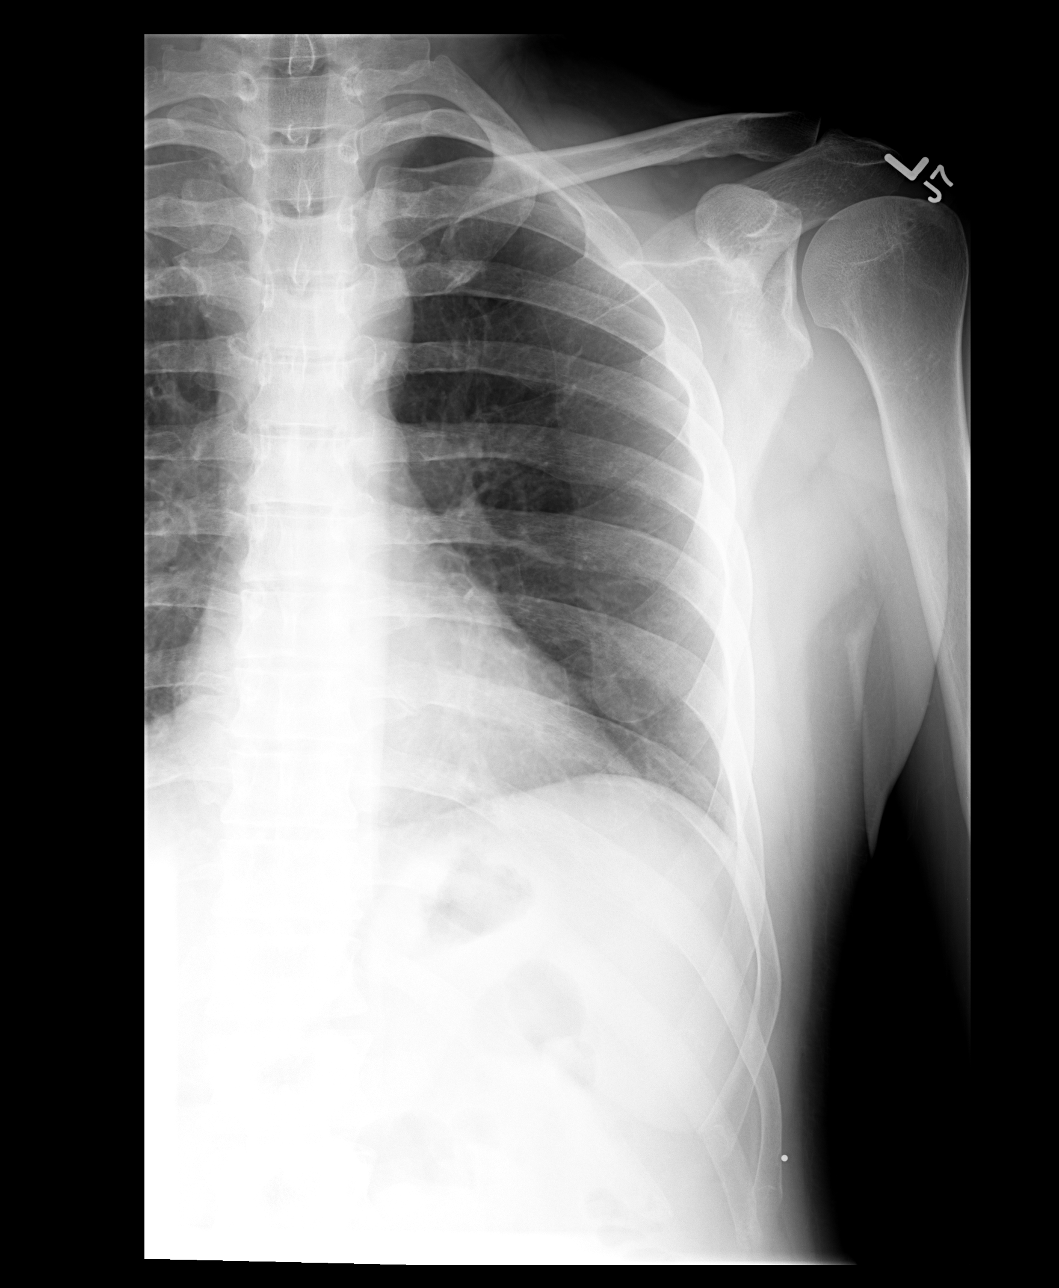

[view not recorded (3 of 3)]
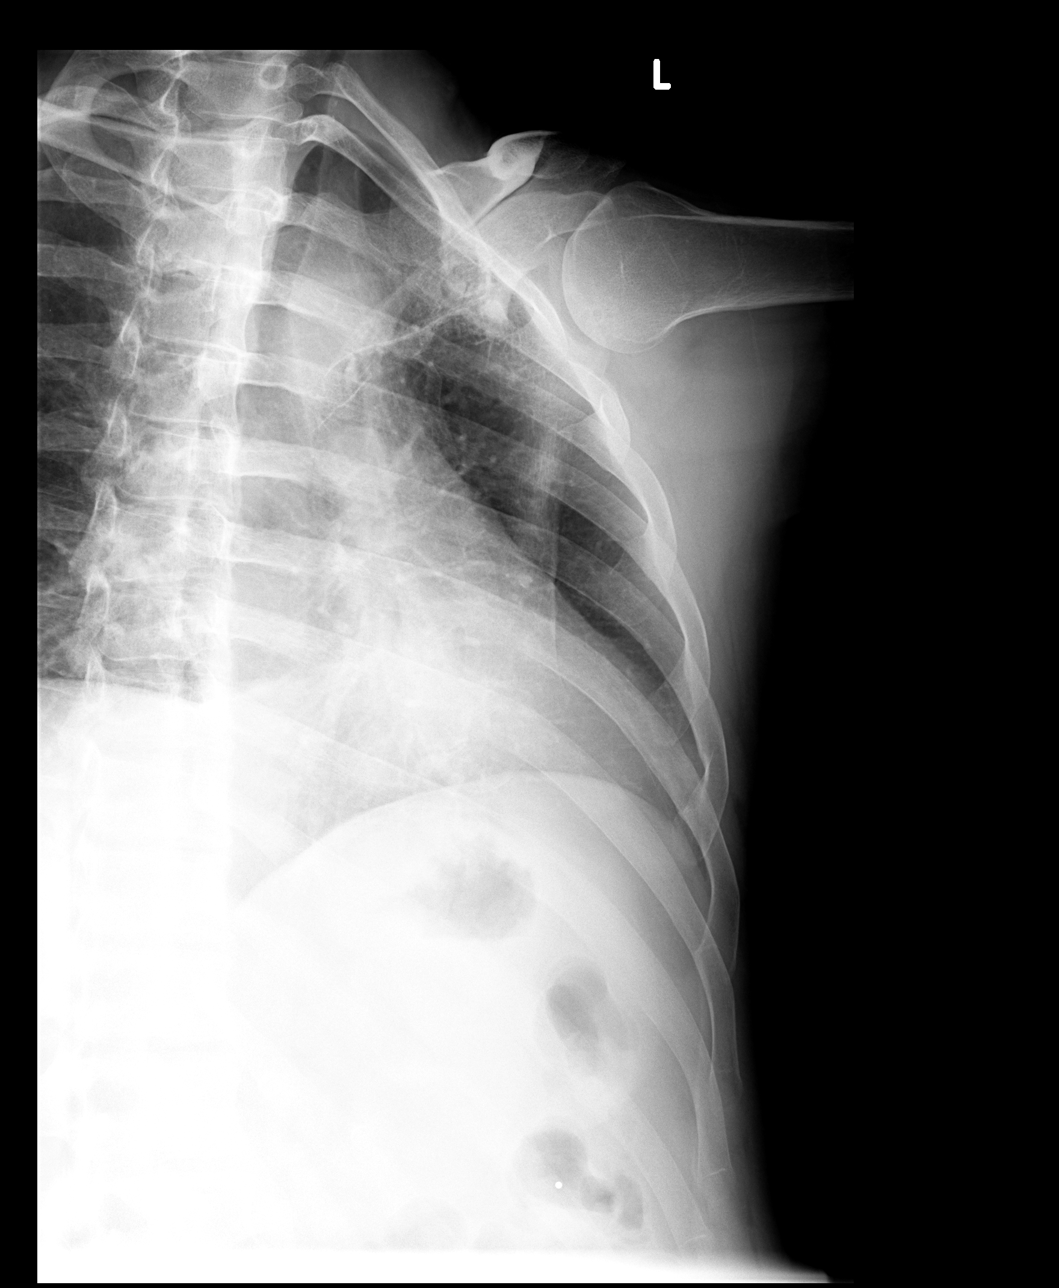

[3 of 3 positions shown; findings below may reference images not displayed]

FINDINGS: Frontal chest shows no focal airspace consolidation. No pneumothorax
or pleural effusion. The cardiopericardial silhouette is within
normal limits for size.

Oblique views of the left ribs obtained with a radiopaque BB
localizing the area of concern show no underlying displaced
left-sided rib fracture.
IMPRESSION: No evidence for displaced acute left rib fracture.

## 2019-01-08 NOTE — Progress Notes (Signed)
-------------------------------------------------------------------------------  Attestation signed by Sherran Needs, MD at 01/08/2019  8:35 PM  I saw the patient on: 01/08/2019    I saw and evaluated the patient.  I discussed the care with the resident and agree with the findings and plan as documented in the attached note.    Sherran Needs, MD  -------------------------------------------------------------------------------      [facility] Progress Note  Name: Kerry Mercado  Room: 6708/6708-X  DOB: [birth year]  MRN: [id number]  Hospital day: 11  Admission date: 12/27/2018  Attending physician: Jarold Motto Concepc*    24 Hour Events/Subjective:     - Patient has no complaints today. Tolerating diet. Enjoys food here at [facility]  - POC Blood glucose have ranged from 105-247 in the last 24 hours    PLAN:  - transition from meropenem 1g q8 hours to ertapenem 1g q24h, end date 01/10/2019  - continue insulin per endocrine, discharge regimen established  - continue tacrolimus 1mg  BID, cellcept 750mg  BID, pred 5mg   - No need for daily labs, will d/c    DISPO:Patient will be in-house until end of abx on 4/12.Pt notes family is coming from [place] and will then leave up to [facility] on Sunday    Vitals/Physical Exam:     Vital Sign Min/Max (last 24 hours)     Value Average Min Max    Temp  36.5 C (97.78 F)  36.3 C (97.3 F)  36.8 C (98.3 F)    Pulse  68.2  60  79    Resp  16.4  16  18     BP: Systolic  131.40  107  (!) 142    BP: Diastolic  71.60  66  78    SpO2  98.4 %  94 %  100 %              Intake/Output Summary (Last 24 hours) at 01/08/2019 0944  Last data filed at 01/08/2019 0800  Gross per 24 hour   Intake 1127 ml   Output 4005 ml   Net -2878 ml     Weight: 77.1 kg (170 lb)      - General: awake and alert, sitting up eating his breakfast, no acute distress  - HEENT: sclera anicteric, no oral lesions, poor dentition   - Neck:  RIJ PICC without erythema,  - Respiratory: symmetrical chest  movements, no accessory muscle use, CTAB, no wheezes, no crackles, breathing comfortably on room air    - Cardiovascular: NR, RR, normal S1 & S2, no m/r/g  - Abdomen: non-distended, soft, non-tender to palpitation    - Skin: warm and dry, no rashes, no jaundice, multiple tattoos  - Musculoskeletal: no swollen/inflamed joints, no joint deformities   - Extremities: no cyanosis, no LE edema, warm to touch  - Psychiatric: cooperative and pleasant, oriented to person, place, time; appropriate affect   - Neurologic: CN II-XII grossly intact; moving all four extremities spontaneously    Current Medications:     Current Facility-Administered Medications   Medication Dose Route Frequency    aspirin  81 mg oral Daily    juice  4 oz oral Q15 Min PRN    Or    glucose  16 g oral Q15 Min PRN    Or    dextrose  25 mL intravenous Q15 Min PRN    Or    glucagon (human recombinant) kit  1 mg intramuscular PRN    enoxaparin  40 mg subcutaneous Q24H [facility]  ertapenem  1,000 mg intravenous Q24H    FLUoxetine  20 mg oral Daily    insulin glargine  15 Units subcutaneous Nightly    insulin lispro  12 Units subcutaneous Nightly    insulin lispro  15 Units subcutaneous Daily with breakfast    insulin lispro  18 Units subcutaneous Daily with lunch    insulin lispro  18 Units subcutaneous Daily with dinner    insulin lispro  3-18 Units subcutaneous 5x Daily    levothyroxine  75 mcg oral Daily    lisinopriL  5 mg oral Daily    melatonin  6 mg oral Nightly PRN    mycophenolate mofetil  750 mg oral Q12H [facility]    nicotine  1 patch transdermal Daily    nicotine polacrilex  4 mg mouth/throat Q2H PRN    omeprazole  40 mg oral QAM AC    predniSONE  5 mg oral Daily    tacrolimus  1 mg oral Q12H [facility]       Recent Labs/Imaging:     Lab Results   Component Value Date    WBC 8.2 01/07/2019    HGB 14.1 01/07/2019    HCT 46 01/07/2019    MCV 81 01/07/2019    PLT 284 01/07/2019     Lab Results   Component Value Date    NA 137 01/07/2019    K  5.0 (H) 01/07/2019    CL 102 01/07/2019    BUN 25 01/07/2019    GLUCOSE 251 (H) 01/07/2019    CALCIUM 9.5 01/07/2019       Microbiology:   - blood cx 3/29 at [facility]: ESBL E. Coli       - resistant: ceftriaxone, trimeth/sulfa, cefazolin, ampicillin       - susceptible: levofloxacin, cipro, augmentin, gentamicin, meropenem, zosyn  - Covid: negative  - blood cx 3/29 ([facility]): NGTD    Radiology  X-ray Chest 2 Views    Impression: No radiographic evidence of acute cardiopulmonary disease.  Electronically signed by Theda Sers on 12/27/2018 3:54 PM  I, Norvel Richards, MD, have reviewed the images and verify the above interpretation on 12/27/2018 3:55 PM.  Electronically signed by  Norvel Richards, MD on 12/27/2018 3:55 PM    Ct Interpretation Of Outside Films (abdomen w/o contrast)    Impression: 1.  Abnormal appearance of the pancreatic transplant with limited evaluation secondary to lack of intravenous contrast and without prior imaging or operative report available for comparison. Central low attenuation within the pancreatic transplant with adjacent stranding. These findings are otherwise not well evaluated secondary to lack of intravenous contrast but may represent transplant pancreatitis and/or sequela of vascular compromise, which can be further evaluated with transplant pancreas ultrasound. 2.  Uncomplicated right lower quadrant renal transplant.  Electronically signed by Richarda Overlie, MD on 12/27/2018 3:55 PM  I, Joaquin Courts, MD, have reviewed the images and verify the above interpretation on 12/27/2018 4:24 PM.  Electronically signed by  Joaquin Courts, MD on 12/27/2018 4:24 PM    CT abdomen with contrast (12/28/18)  1. The patient's pancreatic transplant in the left lower abdomen/upper pelvis is markedly abnormal, likely secondary to transplant rejection or necrotizing pancreatitis. . The irregular roughly tubular fluid attenuation centrally in the transplant may represent a combination of likely  dilated duct and necrosis of the   gland. Indeed in some segments only a very thin rim of soft tissue is seen around this central fluid attenuation structure. There  is also some haziness and stranding in the surrounding fat which could   represent a component of pancreatitis. There does appear to be some flow in the vessels to and from the pancreas as noted.   2. Other than what is likely a small cyst, there is a relatively   normal appearance of the right renal transplant.   3. Mild intrahepatic ductal dilatation is seen that appears to be isolated to the lateral segment of the left lobe anteriorly. The significance of this is not clear. Comparison to prior outside studies may be useful to determine the stability of this finding. Although no central obstructing lesion is definitely seen, follow-up   MRCP imaging may be useful for further evaluation in this regard to rule out early intraductal biliary mucinous tumor (biliary IPN) or cholangiocarcinoma. A secure Epic message was sent to Dr. Judeth Porch   regarding this unexpected finding.   4. Additional findings as noted including cholelithiasis, atherosclerotic disease, markedly atrophic bilateral native kidneys and small presumably atrophic native pancreas     Ultrasound Pancreas (transplant) (12/27/18)  Severely limited evaluation of the transplant pancreas vasculature.   Sonographic findings could represent necrotizing pancreatitis and/or sequela of vascular compromise. Contrasted CT is recommended for further evaluation if concern for pancreatic graft compromise is   present.        Assessment and Plan:     Kerry Mercado is a 53 y.o. 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 Also found to have ESBL E.coli bacteremia likely from gut translocation in setting of significant transplant pancreas inflammation currently being treated with  meropenum.        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 Concern for pancreatic rejection i/s/o recent poor medication compliance. Extensive imaging including CT w/ contrast and transplant pancreas ultrasound c/w markedly abnormal pancreas concerning for rejection +/- pancreatitis w/ concern for necrosis. No evidence of renal transplant rejection  - amylase and lipase WNL  - Hgb A1c 11.4, c-peptide 0.7  - DSA panel w/ moderate DSA against A2 pancreas graft - no DSA against renal transplant  - Immunosuppression:     - continue tacro 1mg  BID    - cellcept 750mg  BID (home regimen but pt has not been taking)    - prednisone 5mg  daily    #Hyperglycemia  #Mild DKA (resolved)  #AGMA (resolved)  New diabetes i/s/o pancreatic rejection  - CTM volume status and electrolytes  - C-peptide 0.7;  A1c 11.4  - endocrine following - final recs in  - continue lantus 15u nightly; lispro 15u breakfast, 18u lunch/dinner, 12u bedtime snack and SSI     #ESBL E. Coli bacteremia  #Fever (resolved)  #Leukocytosis (resolved)  Blood cx at OSH growing ESBL E. Coli. Patient denies localizing infectious symptoms apart from LLQ abdominal pain. CXR and UA without evidence of infection. Concern for gut translocation possibly due to pancreatic inflammation  - ID consulted and following:  - s/p vanc (3/29) / flagyl (3/29-3/30) / cefepime (3/29 - 3/31)  - s/p meropenem 1g q8 hours (3/31- 01/08/19)  - ertapenem 1g q24 hours (4/10 through 4/12)    #HTN  Hypertensive to 180s on arrival but normalized on repeat checks  - pt is not prescribed antihypertensives at home  -  continue lisinopril 5mg     # Diarrhea  Likely due to medication side effects (antibiotics vs immunosuppression).   -Continue to monitor  -Low threshold to send c. Diff rule out     #Homelessness  Pt reports losing employment in 06/2018 and has been homeless  since then. Currently living on friends couch and had previously lost health insurance - now has [employer]  - SW consulted, appreciate recs    #Intrahepatic ductal dilation  - incidental finding on CT abdomen w/ contrast (3/30)  - will need f/u MRCP as outpatient to rule out cholangio or biliary IPN    #Elevated liver enzymes (improving)  Unclear etiology. Suspect medication induced. Hepatitis studies negative.   - may benefit from HBV vaccination given socioeconomic challenges (no evidence of immunity)  - will d/c statin (pt states had not been taking at home)  - CTM    Chronic:  #Hypothyroidism: home synthroid  #GERD: home omeprazole 40mg   #HLD: holding atorvastatin 20mg   #Current smoker: seen by smoking cessation, deferred nicotine agents at this time.   #Depression: home prozac 20mg     MISC  - Code status: Full Code  - Access: PIV  - DVT ppx: lovenox  - Dispo: patient currently homeless-plan to d/c on 4/12 after completing IV abx    Ndang Selena Batten, MD, MPH  January 08, 2019

## 2020-04-03 NOTE — Assessment & Plan Note (Signed)
Patient wishes to wait

## 2022-12-18 ENCOUNTER — Telehealth: Payer: Self-pay | Admitting: OTHER M.D.

## 2022-12-18 NOTE — Telephone Encounter (Signed)
Routed to Dr. Virgilio Frees who is the on call retina fellow for review.    Maryln Gottron, RN

## 2022-12-18 NOTE — Telephone Encounter (Signed)
Triage reviewed with Dr. Virgilio Frees over the phone. Okay to add the patient to Dr. Corinna Gab schedule tomorrow and to advise the patient to keep on NPO after 0900 for possible surgery tomorrow evening. Called and LVM to call back.    Maryln Gottron, RN

## 2022-12-18 NOTE — Telephone Encounter (Signed)
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    Auth to see [facility] received. Triage routed text page Dr. Lyman Speller for review.     Maryln Gottron, RN

## 2022-12-20 ENCOUNTER — Inpatient Hospital Stay
Admission: EM | Admit: 2022-12-20 | Discharge: 2022-12-27 | DRG: 638 | Discharge status: Against Medical Advice | Payer: Medicare Other | Source: Ambulatory Visit | Attending: Student in an Organized Health Care Education/Training Program | Admitting: Student in an Organized Health Care Education/Training Program

## 2022-12-20 ENCOUNTER — Ambulatory Visit: Payer: Self-pay

## 2022-12-20 DIAGNOSIS — H548 Legal blindness, as defined in USA: Secondary | ICD-10-CM | POA: Diagnosis present

## 2022-12-20 DIAGNOSIS — Z5329 Procedure and treatment not carried out because of patient's decision for other reasons: Secondary | ICD-10-CM | POA: Diagnosis not present

## 2022-12-20 DIAGNOSIS — E1022 Type 1 diabetes mellitus with diabetic chronic kidney disease: Secondary | ICD-10-CM | POA: Diagnosis present

## 2022-12-20 DIAGNOSIS — Z87892 Personal history of anaphylaxis: Secondary | ICD-10-CM

## 2022-12-20 DIAGNOSIS — Z7982 Long term (current) use of aspirin: Secondary | ICD-10-CM

## 2022-12-20 DIAGNOSIS — N179 Acute kidney failure, unspecified: Secondary | ICD-10-CM | POA: Diagnosis present

## 2022-12-20 DIAGNOSIS — H4311 Vitreous hemorrhage, right eye: Secondary | ICD-10-CM | POA: Diagnosis present

## 2022-12-20 DIAGNOSIS — E785 Hyperlipidemia, unspecified: Secondary | ICD-10-CM | POA: Diagnosis present

## 2022-12-20 DIAGNOSIS — H4052X0 Glaucoma secondary to other eye disorders, left eye, stage unspecified: Secondary | ICD-10-CM | POA: Diagnosis present

## 2022-12-20 DIAGNOSIS — E1042 Type 1 diabetes mellitus with diabetic polyneuropathy: Secondary | ICD-10-CM | POA: Diagnosis present

## 2022-12-20 DIAGNOSIS — Z940 Kidney transplant status: Secondary | ICD-10-CM

## 2022-12-20 DIAGNOSIS — Z79899 Other long term (current) drug therapy: Secondary | ICD-10-CM

## 2022-12-20 DIAGNOSIS — E870 Hyperosmolality and hypernatremia: Secondary | ICD-10-CM | POA: Diagnosis present

## 2022-12-20 DIAGNOSIS — E10649 Type 1 diabetes mellitus with hypoglycemia without coma: Secondary | ICD-10-CM | POA: Diagnosis not present

## 2022-12-20 DIAGNOSIS — Z794 Long term (current) use of insulin: Secondary | ICD-10-CM

## 2022-12-20 DIAGNOSIS — T86891 Other transplanted tissue failure: Secondary | ICD-10-CM | POA: Diagnosis present

## 2022-12-20 DIAGNOSIS — Z79621 Long term (current) use of calcineurin inhibitor: Secondary | ICD-10-CM

## 2022-12-20 DIAGNOSIS — Z79890 Hormone replacement therapy (postmenopausal): Secondary | ICD-10-CM

## 2022-12-20 DIAGNOSIS — E1100 Type 2 diabetes mellitus with hyperosmolarity without nonketotic hyperglycemic-hyperosmolar coma (NKHHC): Principal | ICD-10-CM

## 2022-12-20 DIAGNOSIS — Z833 Family history of diabetes mellitus: Secondary | ICD-10-CM

## 2022-12-20 DIAGNOSIS — E1065 Type 1 diabetes mellitus with hyperglycemia: Secondary | ICD-10-CM | POA: Diagnosis present

## 2022-12-20 DIAGNOSIS — T8612 Kidney transplant failure: Secondary | ICD-10-CM | POA: Diagnosis present

## 2022-12-20 DIAGNOSIS — H5462 Unqualified visual loss, left eye, normal vision right eye: Secondary | ICD-10-CM

## 2022-12-20 DIAGNOSIS — G8929 Other chronic pain: Secondary | ICD-10-CM | POA: Diagnosis present

## 2022-12-20 DIAGNOSIS — M549 Dorsalgia, unspecified: Secondary | ICD-10-CM | POA: Diagnosis present

## 2022-12-20 DIAGNOSIS — I120 Hypertensive chronic kidney disease with stage 5 chronic kidney disease or end stage renal disease: Secondary | ICD-10-CM | POA: Diagnosis present

## 2022-12-20 DIAGNOSIS — E103593 Type 1 diabetes mellitus with proliferative diabetic retinopathy without macular edema, bilateral: Secondary | ICD-10-CM | POA: Diagnosis present

## 2022-12-20 DIAGNOSIS — Y830 Surgical operation with transplant of whole organ as the cause of abnormal reaction of the patient, or of later complication, without mention of misadventure at the time of the procedure: Secondary | ICD-10-CM | POA: Diagnosis present

## 2022-12-20 DIAGNOSIS — E1069 Type 1 diabetes mellitus with other specified complication: Principal | ICD-10-CM | POA: Diagnosis present

## 2022-12-20 DIAGNOSIS — N189 Chronic kidney disease, unspecified: Secondary | ICD-10-CM | POA: Diagnosis present

## 2022-12-20 DIAGNOSIS — Z91013 Allergy to seafood: Secondary | ICD-10-CM

## 2022-12-20 DIAGNOSIS — E039 Hypothyroidism, unspecified: Secondary | ICD-10-CM | POA: Diagnosis present

## 2022-12-20 DIAGNOSIS — F17210 Nicotine dependence, cigarettes, uncomplicated: Secondary | ICD-10-CM | POA: Diagnosis present

## 2022-12-20 DIAGNOSIS — Z7969 Long term (current) use of other immunomodulators and immunosuppressants: Secondary | ICD-10-CM

## 2022-12-20 DIAGNOSIS — E1039 Type 1 diabetes mellitus with other diabetic ophthalmic complication: Secondary | ICD-10-CM

## 2022-12-20 DIAGNOSIS — E875 Hyperkalemia: Secondary | ICD-10-CM | POA: Diagnosis present

## 2022-12-20 HISTORY — DX: Hypothyroidism, unspecified: E03.9

## 2022-12-20 HISTORY — DX: Hyperlipidemia, unspecified: E78.5

## 2022-12-20 HISTORY — DX: Essential (primary) hypertension: I10

## 2022-12-20 HISTORY — DX: Type 2 diabetes mellitus without complications: E11.9

## 2022-12-20 LAB — CBC WITH DIFFERENTIAL
Basophils % Auto: 0.4 %
Basophils Abs Auto: 0 10*3/uL (ref 0.0–0.2)
Eosinophils % Auto: 0.6 %
Eosinophils Abs Auto: 0 10*3/uL (ref 0.0–0.5)
Hematocrit: 43.9 % (ref 41.0–53.0)
Hemoglobin: 14.1 g/dL (ref 13.5–17.5)
Lymphocytes % Auto: 11 %
Lymphocytes Abs Auto: 0.7 10*3/uL — ABNORMAL LOW (ref 1.0–4.8)
MCH: 27.8 pg (ref 27.0–33.0)
MCHC: 32.1 % (ref 32.0–36.0)
MCV: 86.6 fL (ref 80.0–100.0)
MPV: 8.5 fL (ref 6.8–10.0)
Monocytes % Auto: 6.1 %
Monocytes Abs Auto: 0.4 10*3/uL (ref 0.1–0.8)
Neutrophils % Auto: 81.9 %
Neutrophils Abs Auto: 5.3 10*3/uL (ref 1.8–7.7)
Platelet Count: 206 10*3/uL (ref 130–400)
RDW: 14.3 % (ref 0.0–14.7)
Red Blood Cell Count: 5.07 10*6/uL (ref 4.50–5.90)
White Blood Cell Count: 6.4 10*3/uL (ref 4.5–11.0)

## 2022-12-20 LAB — PHOSPHORUS (PO4): Phosphorus (PO4): 3.5 mg/dL (ref 2.5–4.5)

## 2022-12-20 LAB — BASIC METABOLIC PANEL
Anion Gap: 13 mmol/L (ref 7–15)
Anion Gap: 14 mmol/L (ref 7–15)
Calcium: 8.9 mg/dL (ref 8.6–10.0)
Calcium: 9.1 mg/dL (ref 8.6–10.0)
Carbon Dioxide Total: 24 mmol/L (ref 22–29)
Carbon Dioxide Total: 23 mmol/L (ref 22–29)
Chloride: 88 mmol/L — ABNORMAL LOW (ref 98–107)
Chloride: 90 mmol/L — ABNORMAL LOW (ref 98–107)
Creatinine Serum: 1.22 mg/dL — ABNORMAL HIGH (ref 0.51–1.17)
Creatinine Serum: 1.18 mg/dL — ABNORMAL HIGH (ref 0.51–1.17)
E-GFR Creatinine (Male): 69 mL/min/{1.73_m2}
E-GFR Creatinine (Male): 72 mL/min/{1.73_m2}
Glucose: 828 mg/dL (ref 74–109)
Glucose: 742 mg/dL (ref 74–109)
Potassium: 5.4 mmol/L — ABNORMAL HIGH (ref 3.4–5.1)
Potassium: 4.8 mmol/L (ref 3.4–5.1)
Sodium: 125 mmol/L — ABNORMAL LOW (ref 136–145)
Sodium: 127 mmol/L — ABNORMAL LOW (ref 136–145)
Urea Nitrogen, Blood (BUN): 29 mg/dL — ABNORMAL HIGH (ref 6–20)
Urea Nitrogen, Blood (BUN): 31 mg/dL — ABNORMAL HIGH (ref 6–20)

## 2022-12-20 LAB — BLD GAS VENOUS
Base Excess, Ven: 1 mEq/L (ref <=2)
HCO3, Ven: 27 mEq/L (ref 20–28)
O2 Sat, Ven: 72 % (ref 70–100)
PCO2, Ven: 49 mm Hg (ref 35–50)
PO2, Ven: 39 mm Hg (ref 30–55)
pH, VEN: 7.36 (ref 7.30–7.40)

## 2022-12-20 LAB — MAGNESIUM (MG): Magnesium (Mg): 1.9 mg/dL (ref 1.6–2.4)

## 2022-12-20 LAB — GLUCOSE: Glucose: 714 mg/dL (ref 74–109)

## 2022-12-20 LAB — POC GLUCOSE
POC GLUCOSE: >600 mg/dL (ref 70–99)
POC GLUCOSE: >600 mg/dL (ref 70–99)

## 2022-12-20 MED ORDER — INSULIN REGULAR 100 UNIT/100 ML IN 0.9 % NACL - DKA (GOAL BG 150-200) DRIP
1.0000 [IU]/h | INTRAVENOUS | Status: DC
Start: 2022-12-20 — End: 2022-12-21
  Administered 2022-12-20: 8 [IU]/h via INTRAVENOUS
  Administered 2022-12-21 (×2): 6 [IU]/h via INTRAVENOUS
  Filled 2022-12-20: qty 100

## 2022-12-20 MED ORDER — D10W IV BOLUS - HYPOGLYCEMIA
75.0000 mL | INTRAVENOUS | Status: DC | PRN
Start: 2022-12-20 — End: 2022-12-21

## 2022-12-20 MED ORDER — INSULIN GLARGINE 100 UNIT/ML SUB-Q VIAL
25.0000 [IU] | Freq: Every day | SUBCUTANEOUS | Status: DC
Start: 2022-12-20 — End: 2022-12-21
  Administered 2022-12-20: 25 [IU] via SUBCUTANEOUS
  Filled 2022-12-20: qty 25

## 2022-12-20 MED ORDER — ELECTROLYTE-A INTRAVENOUS SOLUTION
INTRAVENOUS | Status: DC
Start: 2022-12-20 — End: 2022-12-21

## 2022-12-20 MED ORDER — LACTATED RINGERS IV BOLUS - DURATION REQ
1000.0000 mL | INTRAVENOUS | Status: DC
Start: 2022-12-20 — End: 2022-12-20

## 2022-12-20 MED ORDER — FLUORESCEIN 1 MG EYE STRIPS
1.0000 | ORAL_STRIP | OPHTHALMIC | Status: DC
Start: 2022-12-20 — End: 2022-12-21
  Filled 2022-12-20: qty 1

## 2022-12-20 MED ORDER — PROPARACAINE 0.5 % EYE DROPS
2.0000 [drp] | OPHTHALMIC | Status: DC
Start: 2022-12-20 — End: 2022-12-21
  Filled 2022-12-20: qty 15

## 2022-12-20 NOTE — ED Rapid Medical Evaluation (Signed)
Emergency Department Rapid Medical Evaluation Note  Chief Complaint   Patient presents with    Eye Problem       Kerry Mercado is a 57yr old male who presents to the ED with change in vision in right eye.         Plan: complete ED evaluation by an ED Treatment Team.              Electronically signed by: Adora Fridge, MD     This patient was seen as part of a rapid medical evaluation. Please see the ED Provider Note for full details of this patient's care. This note is not intended to be a comprehensive document of the patient's ED visit and does not represent documentation of a medical screening exam.    This patient was instructed that this preliminary assessment and any studies obtained do not constitute the patient's full workup.

## 2022-12-20 NOTE — H&P (Signed)
EMERGENCY OBSERVATION PROVIDER NOTE - Kerry Mercado    Notes will be shared by default.  If this note meets one of the EXCEPTIONS to note sharing, deselect "Share with Patient" at the top of the note and choose a reason in the popup:18486   DOS:  PCP: Soe, Than   Note Started: 12/20/2022 23:19 DOB: 1966/03/31      Patient was placed on Observation Status at:   Chief Complaint   Patient presents with    Eye Problem      Triage  Orders  Workup  Results Grouped  Results Report  ECGs  Micro  VS/Meds   RN Notes  Chart Review   Dispo:18486   Was the patient able to give a FULL AND RELIABLE history:950028   Interpreter used? (Optional):950026    Kerry Mercado is a 57yr old male, who  Past Medical History:15460, presenting to the ED with ***          HISTORY:  Enter/Edit History:18486  No past medical history on file. Allergies   Allergen Reactions    Shellfish Derived Anaphylaxis      No past surgical history on file. No current outpatient medications on file.      Social History     Social History Narrative    Not on file    No family history on file.     LAST VITAL SIGNS:  Temp: 36.8 C (98.2 F) (12/20/22 1857)  Temp src: Oral (12/20/22 1857)  Pulse: 90 (12/20/22 1857)  BP: (!) 143/72 (12/20/22 1857)  Resp: 20 (12/20/22 1857)  SpO2: 97 % (12/20/22 1857)  Weight: 73.6 kg (162 lb 4.1 oz) (12/20/22 1857)    Physical Exam     ASSESSMENT & PLAN AND MEDICAL DECISION MAKING  MEDICAL DECISION MAKING     Please make sure to include any critical / life-threatening diagnoses considered after history and physical:18486  Differential includes, but is not limited to: ***    The results of the ED evaluation were notable for the following:       ED DATA IM:[id number]    ED Medication Administration through 12/20/2022 2319       Date/Time Order Dose Route Action    12/20/2022 2243 PDT Electrolyte Solution A (PLASMA-LYTE A) Infusion -- IV New bag/syringe                    PATIENT SUMMARY Triage  Orders   Workup  Results Grouped  Results Report  ECGs  Micro  VS/Meds   RN Notes  Chart Review   Dispo:18486  ***     ED CONSULTS ORDERED? [.EDCONSULTS] (Optional):30994    Patient admitted to emergency department observation status. The patient presents to the emergency department with a complex medical or surgical problem which requires observation and reassesment to determine if the patient needs admission or can be safely discharged home.    Workup in the Emergency Department above.      Patient admitted to ED Observation with a diagnosis of *** under the  PATHWAYS:18483 protocol. Will continue to monitor in observation.      Further interventions include ***     Re-evaluation, admit, discharge, back to main ED, 5150, ready for placement, or signout to next EDOU provider?:18482         Electronically signed by Enid Derry, NP  [facility]   [phone]

## 2022-12-20 NOTE — Telephone Encounter (Signed)
Multiple attempts made to contact the patient, no answer left messages to the patient and emergency contact person letting them know if the referral is for a sight threatening- no call back received. Letter sent to the address on file.    Maryln Gottron, RN

## 2022-12-20 NOTE — ED Triage Note (Signed)
R eye "floaters" and blurred vision x 1 week. Seen for same at OSH and referred to [facility] ED for optho eval. Hx blindness L eye. Hx DM II, kidney transplant 2010.

## 2022-12-20 NOTE — Telephone Encounter (Addendum)
Received a call back from the [place], patient lives by himself no means of transportation. I explained to her that patient's referral is for a sight threatening condition and needs to be addressed as soon as possible. She expressed understanding but she will not be able to get him in before the clinic closes today. I advised her to have him present to the [facility] ED as there is a 24 hours eye provider who will be able to examined him. Patient's sister will arrange a ride for the patient to the ED. Routed to Dr. Mauri Pole who will be on call today.     Maryln Gottron, RN

## 2022-12-20 NOTE — Telephone Encounter (Signed)
Called and LVM to call back.    Haneen Bernales, RN

## 2022-12-20 NOTE — Consults (Signed)
[x]  Patient was CLEARED for dilation. Dilating drops were instilled in both eyes on 12/20/2022.   [x]  Nursing staff was notified after dilating drops were given and instructed to place a sign.      Ophthalmology Consultation    Reason for Consult: c/f vitreous hemorrhage, right eye   Requesting Attending: Linden Dolin, MD  Location of Service: Emergency Department    Identification/Chief Complaint/History of Present Illness:     Kerry Mercado is a 57yr old male with a history of diabetic retinopathy status post retinopexy both eyes, neovascular glaucoma and blindness left eye who presents with severely decreased vision in right eye, consult for c/f vitreous hemorrhage.     Patient reports that he woke up 8 days ago and his right eye vision was suddenly severely decreased. He describes it as a red fog; he is able to see it through it but his vision is now much more limited. He also sees some spots. Denies flashes, dark curtain/shadow.    Of note, he went to the [facility] ED about 5 days ago after talking to an advice nurse about his vision symptoms. Their examination was concerning for eye ultrasound findings that could be indicative of retinal detachment. Patient was attempted to be seen in clinic, but given difficult transportation, patient was instructed to come to [facility] ED.    Patient likely to be in the ED observation unit since he was noted to have blood glucose level in the 800s, currently on insulin drip.    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    Medications:    Current Facility-Administered Medications:     Dextrose 10% 7.5-15 g IVPB 75-150 mL, 75-150 mL, IV, PRN,  Drema Dallas Lorenza Evangelist, MD    Electrolyte Solution A (PLASMA-LYTE A) Infusion, , IV, CONTINUOUS, Linden Dolin, MD, Last Rate: 200 mL/hr at 12/20/22 2243, New Bag at 12/20/22 2243    Fluorescein (FLUOROSTRIP) Ophthalmic Strip 1 strip, 1 strip, RIGHT Eye, AT BEDSIDE, Rhona Leavens, MD    Fluorescein Armstead Peaks) Ophthalmic Strip 1 strip, 1 strip, LEFT Eye, AT BEDSIDE, Rhona Leavens, MD    Insulin Glargine (LANTUS) 100 unit/mL Injection 25 Units, 25 Units, SUBCUTANEOUS, Daily Bedtime Insulin, Linden Dolin, MD, 25 Units at 12/20/22 2341    Insulin Regular 100 Units in NaCl 0.9% 100 mL (Adult DKA/HHS - Goal BG 150-200 mg/dL) Infusion, 1-20 Units/hr, IV, CONTINUOUS, Linden Dolin, MD, Last Rate: 8 mL/hr at 12/20/22 2339, 8 Units/hr at 12/20/22 2339    Proparacaine (ALCAINE, OPHTHETIC) 0.5% Ophthalmic Solution 2 drop, 2 drop, BOTH Eyes, AT BEDSIDE, Rhona Leavens, MD    Current Outpatient Medications:     Aspirin 81 mg Chewable Tablet, Take 1 tablet by mouth every day., Disp: , Rfl:     Atorvastatin (LIPITOR) 20 mg Tablet, Take 1 tablet by mouth every day., Disp: , Rfl:     BASAGLAR KWIKPEN U-100 INSULIN 100 unit/mL (3 mL) Pen, Inject 20 Units subcutaneously every day at bedtime., Disp: , Rfl:     LevoTHYROxine 75 mcg Tablet, Take 1 milli-units by mouth every morning before a meal.,  Disp: , Rfl:     Lisinopril (PRINIVIL, ZESTRIL) 5 mg Tablet, Take 1 tablet by mouth every day., Disp: , Rfl:     Mycophenolate (MMF) 250 mg Capsule, Take 3 capsules by mouth 2 times daily., Disp: , Rfl:     Omeprazole (PRILOSEC) 40 mg Delayed Release Capsule, Take 1 capsule by mouth once daily before a meal., Disp: , Rfl:     PROZAC 40 mg Capsule, Take 1 capsule by mouth every day., Disp: , Rfl:     Tacrolimus (FK-506) 1 mg Capsule, Take 1 capsule by mouth 2 times daily., Disp: , Rfl:     Allergies:  Allergies   Allergen Reactions    Shellfish Derived Anaphylaxis       Images:    B scan right eye  12/20/2022:          Ophthalmology Exam:    Base Eye Exam       Visual Acuity (Snellen - Linear)         Right Left    Near cc CF at 3ft NLP              Tonometry (Tonopen, 11:20 PM)         Right Left    Pressure 21 51              Pupils         Dark Light Shape React APD    Right 3 2 Round Brisk None    Left 4 4 Round Minimal +3              Visual Fields         Right Left    Restrictions Total superior temporal, inferior temporal, superior nasal, inferior nasal deficiencies Total superior temporal, inferior temporal, superior nasal, inferior nasal deficiencies                  Slit Lamp and Fundus Exam       External Exam         Right Left    External Normal Normal              Portable Slit Lamp Exam         Right Left    Lids/Lashes Normal Normal    Conjunctiva/Sclera White and Quiet White and Quiet    Cornea Clear Clear    Anterior Chamber Deep and Quiet Deep and Quiet    Iris Round and Reactive, no neovascularization Round and Reactive, no neovascularization    Lens 2+ Nuclear sclerosis 2+ Nuclear sclerosis    Anterior Vitreous Hemorrhage, Vitreous hemorrhage Normal              Fundus Exam         Right Left    Disc Hazy behind vit heme Pallor    Macula Hazy behind vit heme Normal, Flat, No Heme    Vessels Hazy behind vit heme Normal    Periphery 360 peripheral laser retinopexy scars 360 peripheral laser retinopexy scars                    --------------------------------------------------------------------------------------------------------------------------------------    Impression:  Vitreous Hemorrhage, right eye  Long standing diabetic retinopathy status post retinopexy, both eyes    - Patient with acute onset of red fog 8 days ago (floaters, "dark haze", flashing lights)  - Dilated exam showed diffusely hazy view  - B-scan ultrasound showed vitreous hemorrhage without retinal detachment   - Most  likely etiology is diabetic retinopathy in the setting of uncontrolled diabetes    - Recommend obtaining  repeat A1c level  - Glycemic control per ED and/or primary team  - Recommend minimal activity, sleep with head of bed elevated, avoid non medically necessary blood thinners and NSAIDs  - Return precautions discussed and emergency line reiterated  - Upon discharge, patient should follow-up with the Retina Service in 1 week  - Recommend lensometer reading of spectacles at that time to determine degree of myopia. Once vit heme has resolved should establish care with our retina service   - Welcome to follow-up at [facility] if his insurance is accepted or with preferred provider. Please place a referral. We will send a staff message to our scheduling team.   [phone]   Herbert Deaner. [place], [facility] Clinic (1st floor)   [place], [place] [address]  - Please place ED health navigator consult as urgent to activate PRESS pathway    Patient was seen and evaluated independently. If admitted, patient will be staffed during the daytime. Please see attending's attestation for any changes in assessment and plan.    Thank you for this interesting consult. Please page ophthalmology on-call [phone]) with any questions or concerns, or if no longer clear for dilation. If using TigerText or Secure Chat, please check the Admin Notes under 1stCall on On-Call for the appropriate on-call resident.    Valerie Salts, MD  PGY-2 Ophthalmology  Available via TigerText  Pager: [phone]

## 2022-12-21 ENCOUNTER — Encounter: Payer: Self-pay | Admitting: Emergency Medicine

## 2022-12-21 ENCOUNTER — Ambulatory Visit: Payer: Self-pay

## 2022-12-21 DIAGNOSIS — E785 Hyperlipidemia, unspecified: Secondary | ICD-10-CM

## 2022-12-21 DIAGNOSIS — E875 Hyperkalemia: Secondary | ICD-10-CM | POA: Insufficient documentation

## 2022-12-21 DIAGNOSIS — I10 Essential (primary) hypertension: Secondary | ICD-10-CM

## 2022-12-21 DIAGNOSIS — Z940 Kidney transplant status: Secondary | ICD-10-CM

## 2022-12-21 DIAGNOSIS — E113593 Type 2 diabetes mellitus with proliferative diabetic retinopathy without macular edema, bilateral: Secondary | ICD-10-CM

## 2022-12-21 DIAGNOSIS — Z9889 Other specified postprocedural states: Secondary | ICD-10-CM

## 2022-12-21 DIAGNOSIS — E1039 Type 1 diabetes mellitus with other diabetic ophthalmic complication: Secondary | ICD-10-CM

## 2022-12-21 DIAGNOSIS — N179 Acute kidney failure, unspecified: Secondary | ICD-10-CM

## 2022-12-21 DIAGNOSIS — T86891 Other transplanted tissue failure: Secondary | ICD-10-CM

## 2022-12-21 DIAGNOSIS — E039 Hypothyroidism, unspecified: Secondary | ICD-10-CM

## 2022-12-21 DIAGNOSIS — H4311 Vitreous hemorrhage, right eye: Secondary | ICD-10-CM | POA: Insufficient documentation

## 2022-12-21 DIAGNOSIS — R739 Hyperglycemia, unspecified: Secondary | ICD-10-CM

## 2022-12-21 DIAGNOSIS — E870 Hyperosmolality and hypernatremia: Secondary | ICD-10-CM | POA: Insufficient documentation

## 2022-12-21 DIAGNOSIS — H5462 Unqualified visual loss, left eye, normal vision right eye: Secondary | ICD-10-CM

## 2022-12-21 DIAGNOSIS — E1100 Type 2 diabetes mellitus with hyperosmolarity without nonketotic hyperglycemic-hyperosmolar coma (NKHHC): Principal | ICD-10-CM

## 2022-12-21 LAB — BASIC METABOLIC PANEL
Anion Gap: 10 mmol/L (ref 7–15)
Anion Gap: 8 mmol/L (ref 7–15)
Calcium: 9 mg/dL (ref 8.6–10.0)
Calcium: 8.8 mg/dL (ref 8.6–10.0)
Carbon Dioxide Total: 28 mmol/L (ref 22–29)
Carbon Dioxide Total: 28 mmol/L (ref 22–29)
Chloride: 100 mmol/L (ref 98–107)
Chloride: 104 mmol/L (ref 98–107)
Creatinine Serum: 1 mg/dL (ref 0.51–1.17)
Creatinine Serum: 0.92 mg/dL (ref 0.51–1.17)
E-GFR Creatinine (Male): 87 mL/min/{1.73_m2}
E-GFR Creatinine (Male): 96 mL/min/{1.73_m2}
Glucose: 188 mg/dL — ABNORMAL HIGH (ref 74–109)
Glucose: 80 mg/dL (ref 74–109)
Potassium: 3.8 mmol/L (ref 3.4–5.1)
Potassium: 3.4 mmol/L (ref 3.4–5.1)
Sodium: 138 mmol/L (ref 136–145)
Sodium: 140 mmol/L (ref 136–145)
Urea Nitrogen, Blood (BUN): 25 mg/dL — ABNORMAL HIGH (ref 6–20)
Urea Nitrogen, Blood (BUN): 21 mg/dL — ABNORMAL HIGH (ref 6–20)

## 2022-12-21 LAB — POC GLUCOSE
POC GLUCOSE: 104 mg/dL — ABNORMAL HIGH (ref 70–99)
POC GLUCOSE: 113 mg/dL — ABNORMAL HIGH (ref 70–99)
POC GLUCOSE: 124 mg/dL — ABNORMAL HIGH (ref 70–99)
POC GLUCOSE: 145 mg/dL — ABNORMAL HIGH (ref 70–99)
POC GLUCOSE: 230 mg/dL — ABNORMAL HIGH (ref 70–99)
POC GLUCOSE: 252 mg/dL — ABNORMAL HIGH (ref 70–99)
POC GLUCOSE: 258 mg/dL — ABNORMAL HIGH (ref 70–99)
POC GLUCOSE: 280 mg/dL — ABNORMAL HIGH (ref 70–99)
POC GLUCOSE: 319 mg/dL — ABNORMAL HIGH (ref 70–99)
POC GLUCOSE: 461 mg/dL — ABNORMAL HIGH (ref 70–99)
POC GLUCOSE: 72 mg/dL (ref 70–99)
POC GLUCOSE: 81 mg/dL (ref 70–99)
POC GLUCOSE: 89 mg/dL (ref 70–99)

## 2022-12-21 LAB — HEPATITIS C AB SCREEN: Hepatitis C Ab Screen: NONREACTIVE

## 2022-12-21 LAB — TACROLIMUS TROUGH (FK506): Tacrolimus (FK506): <2 ng/mL — ABNORMAL LOW (ref 5.0–15.0)

## 2022-12-21 LAB — MAGNESIUM (MG): Magnesium (Mg): 2.1 mg/dL (ref 1.6–2.4)

## 2022-12-21 LAB — THYROID STIMULATING HORMONE: Thyroid Stimulating Hormone: 2.87 u[IU]/mL (ref 0.27–4.20)

## 2022-12-21 LAB — PHOSPHORUS (PO4): Phosphorus (PO4): 2.8 mg/dL (ref 2.5–4.5)

## 2022-12-21 LAB — THYROXINE, FREE (FREE T4): Thyroxine, Free (Free T4): 1.37 ng/dL (ref 0.93–1.70)

## 2022-12-21 LAB — HIV AG/AB COMBO SCREEN: HIV Ag/Ab Combo Screen: NONREACTIVE

## 2022-12-21 MED ORDER — GLUCOSE 4 GRAM CHEWABLE TABLET
12.0000 g | CHEWABLE_TABLET | ORAL | Status: DC | PRN
Start: 2022-12-21 — End: 2022-12-21

## 2022-12-21 MED ORDER — FLUOXETINE 20 MG CAPSULE
40.0000 mg | ORAL_CAPSULE | Freq: Every day | ORAL | Status: DC
Start: 2022-12-21 — End: 2022-12-27
  Administered 2022-12-21 – 2022-12-26 (×6): 40 mg via ORAL
  Filled 2022-12-21 (×6): qty 2

## 2022-12-21 MED ORDER — D10W IV BOLUS - HYPOGLYCEMIA
10.0000 g | INTRAVENOUS | Status: DC | PRN
Start: 2022-12-21 — End: 2022-12-22

## 2022-12-21 MED ORDER — GLUCAGON HCL 1 MG/ML SOLUTION FOR INJECTION
1.0000 mg | INTRAMUSCULAR | Status: DC | PRN
Start: 2022-12-21 — End: 2022-12-22

## 2022-12-21 MED ORDER — ACETAMINOPHEN 325 MG TABLET
650.0000 mg | ORAL_TABLET | ORAL | Status: DC | PRN
Start: 2022-12-21 — End: 2022-12-27
  Administered 2022-12-23 – 2022-12-26 (×4): 650 mg via ORAL
  Filled 2022-12-21 (×4): qty 2

## 2022-12-21 MED ORDER — TACROLIMUS 1 MG CAPSULE, IMMEDIATE-RELEASE
1.0000 mg | ORAL_CAPSULE | Freq: Two times a day (BID) | ORAL | Status: DC
Start: 2022-12-21 — End: 2022-12-24
  Administered 2022-12-21 – 2022-12-24 (×8): 1 mg via ORAL
  Filled 2022-12-21 (×8): qty 1

## 2022-12-21 MED ORDER — INSULIN ASPART (U-100) 100 UNIT/ML (3 ML) SUBCUTANEOUS PEN
6.0000 [IU] | PEN_INJECTOR | Freq: Three times a day (TID) | SUBCUTANEOUS | Status: DC
Start: 2022-12-21 — End: 2022-12-23
  Administered 2022-12-22 (×2): 6 [IU] via SUBCUTANEOUS
  Filled 2022-12-21: qty 300

## 2022-12-21 MED ORDER — LISINOPRIL 2.5 MG TABLET
5.0000 mg | ORAL_TABLET | Freq: Every day | ORAL | Status: DC
Start: 2022-12-21 — End: 2022-12-21

## 2022-12-21 MED ORDER — PANTOPRAZOLE 40 MG TABLET,DELAYED RELEASE
40.0000 mg | DELAYED_RELEASE_TABLET | Freq: Every day | ORAL | Status: DC
Start: 2022-12-21 — End: 2022-12-27
  Administered 2022-12-21 – 2022-12-26 (×6): 40 mg via ORAL
  Filled 2022-12-21 (×4): qty 1
  Filled 2022-12-21: qty 2
  Filled 2022-12-21: qty 1

## 2022-12-21 MED ORDER — D10 / 0.45% NACL IV INFUSION
INTRAVENOUS | Status: DC
Start: 2022-12-21 — End: 2022-12-22
  Filled 2022-12-21 (×6): qty 1000

## 2022-12-21 MED ORDER — CAPSAICIN 0.025 % TOPICAL CREAM
TOPICAL_CREAM | Freq: Two times a day (BID) | TOPICAL | Status: DC | PRN
Start: 2022-12-21 — End: 2022-12-27
  Filled 2022-12-21: qty 60

## 2022-12-21 MED ORDER — INSULIN GLARGINE 100 UNIT/ML SUB-Q VIAL
15.0000 [IU] | Freq: Every day | SUBCUTANEOUS | Status: DC
Start: 2022-12-21 — End: 2022-12-23
  Administered 2022-12-21: 15 [IU] via SUBCUTANEOUS
  Filled 2022-12-21: qty 15
  Filled 2022-12-21: qty 1000

## 2022-12-21 MED ORDER — LEVOTHYROXINE 25 MCG TABLET
75.0000 ug | ORAL_TABLET | Freq: Every day | ORAL | Status: DC
Start: 2022-12-21 — End: 2022-12-27
  Administered 2022-12-21 – 2022-12-26 (×6): 75 ug via ORAL
  Filled 2022-12-21 (×7): qty 3

## 2022-12-21 MED ORDER — GLUCOSE 4 GRAM CHEWABLE TABLET
12.0000 g | CHEWABLE_TABLET | ORAL | Status: DC | PRN
Start: 2022-12-21 — End: 2022-12-25

## 2022-12-21 MED ORDER — MELATONIN 3 MG TABLET
3.0000 mg | ORAL_TABLET | Freq: Every day | ORAL | Status: DC | PRN
Start: 2022-12-21 — End: 2022-12-27
  Administered 2022-12-22 – 2022-12-25 (×2): 3 mg via ORAL
  Filled 2022-12-21 (×3): qty 1

## 2022-12-21 MED ORDER — ONDANSETRON HCL (PF) 4 MG/2 ML INJECTION SOLUTION
4.0000 mg | Freq: Three times a day (TID) | INTRAMUSCULAR | Status: DC | PRN
Start: 2022-12-21 — End: 2022-12-27

## 2022-12-21 MED ORDER — INSULIN ASPART (U-100) 100 UNIT/ML (3 ML) SUBCUTANEOUS PEN
1.0000 [IU] | PEN_INJECTOR | Freq: Every day | SUBCUTANEOUS | Status: DC
Start: 2022-12-21 — End: 2022-12-23
  Administered 2022-12-21: 3 [IU] via SUBCUTANEOUS
  Filled 2022-12-21: qty 3

## 2022-12-21 MED ORDER — INSULIN ASPART (U-100) 100 UNIT/ML (3 ML) SUBCUTANEOUS PEN
1.0000 [IU] | PEN_INJECTOR | Freq: Four times a day (QID) | SUBCUTANEOUS | Status: DC
Start: 2022-12-21 — End: 2022-12-21

## 2022-12-21 MED ORDER — INSULIN ASPART (U-100) 100 UNIT/ML (3 ML) SUBCUTANEOUS PEN
1.0000 [IU] | PEN_INJECTOR | Freq: Three times a day (TID) | SUBCUTANEOUS | Status: DC
Start: 2022-12-21 — End: 2022-12-22
  Administered 2022-12-21: 4 [IU] via SUBCUTANEOUS
  Administered 2022-12-22: 8 [IU] via SUBCUTANEOUS
  Filled 2022-12-21: qty 4

## 2022-12-21 MED ORDER — PROCHLORPERAZINE EDISYLATE 10 MG/2 ML (5 MG/ML) INJECTION SOLUTION
5.0000 mg | Freq: Four times a day (QID) | INTRAMUSCULAR | Status: DC | PRN
Start: 2022-12-21 — End: 2022-12-27

## 2022-12-21 MED ORDER — DIPHENHYDRAMINE-ZINC ACETATE 1 %-0.1 % TOPICAL CREAM
TOPICAL_CREAM | Freq: Three times a day (TID) | TOPICAL | Status: DC | PRN
Start: 2022-12-21 — End: 2022-12-27

## 2022-12-21 MED ORDER — MYCOPHENOLATE MOFETIL 250 MG CAPSULE
750.0000 mg | ORAL_CAPSULE | Freq: Two times a day (BID) | ORAL | Status: DC
Start: 2022-12-21 — End: 2022-12-27
  Administered 2022-12-21 – 2022-12-26 (×13): 750 mg via ORAL
  Filled 2022-12-21 (×13): qty 3

## 2022-12-21 MED ORDER — ATORVASTATIN 10 MG TABLET
20.0000 mg | ORAL_TABLET | Freq: Every day | ORAL | Status: DC
Start: 2022-12-21 — End: 2022-12-22
  Administered 2022-12-21: 20 mg via ORAL
  Filled 2022-12-21: qty 2

## 2022-12-21 MED ORDER — POLYETHYLENE GLYCOL 3350 17 GRAM ORAL POWDER PACKET
17.0000 g | Freq: Every day | ORAL | Status: DC
Start: 2022-12-21 — End: 2022-12-27
  Administered 2022-12-23 – 2022-12-25 (×2): 17 g via ORAL
  Filled 2022-12-21 (×3): qty 1

## 2022-12-21 MED ORDER — MAGNESIUM SULFATE 2 GRAM/50 ML (4 %) IN WATER INTRAVENOUS PIGGYBACK
2.0000 g | INJECTION | INTRAVENOUS | Status: DC | PRN
Start: 2022-12-21 — End: 2022-12-21

## 2022-12-21 MED ORDER — INSULIN ASPART (U-100) 100 UNIT/ML (3 ML) SUBCUTANEOUS PEN
1.0000 [IU] | PEN_INJECTOR | SUBCUTANEOUS | Status: DC
Start: 2022-12-21 — End: 2022-12-21

## 2022-12-21 MED ORDER — POTASSIUM BICARBONATE-CITRIC ACID 25 MEQ EFFERVESCENT TABLET
25.0000 meq | EFFERVESCENT_TABLET | ORAL | Status: DC | PRN
Start: 2022-12-21 — End: 2022-12-21

## 2022-12-21 MED ORDER — D5 / 0.45% NACL IV INFUSION
INTRAVENOUS | Status: DC | PRN
Start: 2022-12-21 — End: 2022-12-21

## 2022-12-21 MED ORDER — D10W IV BOLUS - HYPOGLYCEMIA
10.0000 g | INTRAVENOUS | Status: DC | PRN
Start: 2022-12-21 — End: 2022-12-25

## 2022-12-21 MED ORDER — D5 / 0.45% NACL IV INFUSION
INTRAVENOUS | Status: DC
Start: 2022-12-21 — End: 2022-12-27

## 2022-12-21 MED ORDER — BISACODYL 5 MG TABLET,DELAYED RELEASE
10.0000 mg | DELAYED_RELEASE_TABLET | ORAL | Status: DC | PRN
Start: 2022-12-21 — End: 2022-12-27

## 2022-12-21 MED ORDER — GLUCAGON HCL 1 MG/ML SOLUTION FOR INJECTION
1.0000 mg | INTRAMUSCULAR | Status: DC | PRN
Start: 2022-12-21 — End: 2022-12-25

## 2022-12-21 MED ORDER — INSULIN ASPART (U-100) 100 UNIT/ML (3 ML) SUBCUTANEOUS PEN
1.0000 [IU] | PEN_INJECTOR | Freq: Three times a day (TID) | SUBCUTANEOUS | Status: DC
Start: 2022-12-21 — End: 2022-12-21
  Administered 2022-12-21: 3 [IU] via SUBCUTANEOUS
  Filled 2022-12-21: qty 3

## 2022-12-21 MED ORDER — ASPIRIN 81 MG CHEWABLE TABLET
81.0000 mg | CHEWABLE_TABLET | Freq: Every day | ORAL | Status: DC
Start: 2022-12-21 — End: 2022-12-21

## 2022-12-21 MED ORDER — CAMPHOR-MENTHOL 0.5 %-0.5 % LOTION
TOPICAL_LOTION | Freq: Three times a day (TID) | TOPICAL | Status: DC | PRN
Start: 2022-12-21 — End: 2022-12-27

## 2022-12-21 NOTE — ED Nursing Note (Signed)
Pt. sssisted to the bathroom, ambulatory and denies discomfort

## 2022-12-21 NOTE — H&P (Addendum)
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 Reports he woke up in the morning and the vision to the R eye was severely decreased. Patient reports a "red fog". He does have limited vision through it and reports he is seeing some spots. Patient does have an extensive ophthalmic history to include diabetic retinopathy status post retinopexy both eyes, neovascular glaucoma and blindness left eye. He denies injury or trauma to the R eye. Endorses he has been unable to self inject his insulin due to limited vision. Additionally, patient has an extensive diabetic history to include renal transplant in 2011 and pancreatic transplant in 2012. 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  Upon arrival to ED, BG >800mg /dl, pH 7.36, anion gap 13, sodium 125 (corrected sodium 139), sCr 1.22. Labs otherwise reassuring. During his stay in the ED, he was given 25u Lantus and insulin gtt was initiated. POC glucose dropped to 75mg /dl and IVF were changed to D10/0.45%NaCl. Upon examination, patient is resting comfortably in gurney. He denies eye pain, chest pain, shortness of breath, N/V/D.       HISTORY       Past Medical History:   Diagnosis Date    DM2 (diabetes mellitus, type 2)     HTN (hypertension)     Hyperlipemia     Hypothyroidism     No past surgical history on file.   Social History     Socioeconomic History    Marital status: SINGLE     Spouse name: Not on file    Number of children: Not on file    Years of education: Not on file    Highest education level: Not on file   Occupational History    Not on file   Tobacco  Use    Smoking status: Not on file    Smokeless tobacco: Not on file   Substance and Sexual Activity    Alcohol use: Not on file    Drug use: Not on file    Sexual activity: Not on file    No family history on file.    The history section was last reviewed by Enid Derry, NP on Dec 21, 2022.      ALLERGIES     Shellfish Derived    Anaphylaxis    PRIOR TO ADMISSION MEDICATIONS      Prior to Admission Medications   Prescriptions   Aspirin 81 mg Chewable Tablet   Sig: Take 1 tablet by mouth every day.   Atorvastatin (LIPITOR) 20 mg Tablet   Sig: Take 1 tablet by mouth every day.   BASAGLAR KWIKPEN U-100 INSULIN 100 unit/mL (3 mL) Pen   Sig: Inject 20 Units subcutaneously every day at bedtime.   LevoTHYROxine 75 mcg Tablet   Sig: Take 1 milli-units by mouth every morning before a meal.   Lisinopril (PRINIVIL, ZESTRIL) 5 mg Tablet   Sig: Take 1 tablet by mouth every day.   Mycophenolate (MMF) 250 mg Capsule   Sig: Take 3 capsules by mouth 2 times daily.  Omeprazole (PRILOSEC) 40 mg Delayed Release Capsule   Sig: Take 1 capsule by mouth once daily before a meal.   PROZAC 40 mg Capsule   Sig: Take 1 capsule by mouth every day.   Tacrolimus (FK-506) 1 mg Capsule   Sig: Take 1 capsule by mouth 2 times daily.      Facility-Administered Medications: None         VITALS  Summary  Temp Min: 36.5 C (97.7 F) Max: 36.9 C (98.4 F)  BP: (108-148)/(70-98)   Pulse Min: 68 Max: 90  Resp Min: 12 Max: 20  SpO2 Min: 97 % Max: 99 %      PHYSICAL EXAM  General: Alert, appropriate mood, appears stated age, in no acute distress.  HEENT: Atraumatic, PERRLA, EOMI, face symmetrical. Hearing grossly intact. Throat clear.  Neck: Supple. No JVP. No thyromegaly.   CVS: Regular rate and rhythm, without murmurs, rubs, or gallops.   Lungs: Clear to auscultation bilaterally. No wheezes, crackles, or rhonchi.  Abd: Soft, nontender. Normoactive bowel sounds, without organomegaly.  Ext: No cyanosis, clubbing, or edema.     Skin: Warm and  well-perfused. No rash.   Neuro: Alert and oriented to person, time, place, situation. Clear and logical speech.   MSK: Joints with full range of motion, not hot or swollen. Strength grossly 5/5. Sensation grossly intact to light touch.     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 Work up in ED showed HHS. Not DKA. He was seen by Ophthalmology and confirmed patient has vitreous hemorrhage to R eye     #Hyperglycemia/HHS  #IDDM  Upon arrival to the ED, patients glucose level was >800mg /dl. Patient has been unable to self administer insulin due to vision changes.No evidence of DKA. S/p insulin gtt, lantus, IVF. Patient is currently off insulin gtt at time of admission with POC glucose ranging between 70-110mg /dl. 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     -IGT consulted- decrease Basal Insulin Glargine (Lantus) 15 units SQ qHS   - Nutritional Aspart (Novolog) None (at this time)   - Change to Sensitive Correctional Aspart (Novolog) Scale SQ TID AC   -closely monitor BMP and POC glucose   -continue D10/0.45%NaCl  -CCS diet  -Continue to follow per IGT recs    #R Eye Vision Changes / R Eye Vitreous Hemorrhage  Patient reports that he woke up 8 days ago and his right eye vision was suddenly severely decreased. He describes it as a red fog; he is able to see it through it but his vision is now much more limited. He also sees some spots. Denies flashes, dark curtain/shadow.   -Ophthalmology consulted-- - Dilated exam showed diffusely hazy view  - B-scan ultrasound showed vitreous hemorrhage without retinal detachment   - Most likely  etiology is diabetic retinopathy in the setting of uncontrolled diabetes    - Recommend obtaining repeat A1c level - pending  - Recommend minimal activity, sleep with head of bed elevated, avoid non medically necessary blood thinners and NSAIDs  - Return precautions discussed and emergency line reiterated  - Upon discharge, patient  should follow-up with the Retina Service in 1 week-referral placed   - Recommend lensometer reading of spectacles at that time to determine degree of myopia. Once vit heme has resolved should establish care with our retina service   #AKI  sCr upon arrival to ED was 1.22. Likely secondary to HHS. Unknown baseline. Current sCr 0.92. Of note, patient is s/p kidney transplant in 2011.    -Monitor daily BMP   -glucose correction as noted as above      #HLD  Continue atorvastatin     #Hypothyroidism  Continue levothyroxine   -obtain TSH/T4    #HTN  Continue PTA lisinopril     FEN: DIABETIC DIET - MALE  DVT Prophylaxis: Sequential compression devices (SCDs) in the setting of vitreous hemorrhage   Advanced Care Planning/Code: Full   Disposition: Anticipate pending PT/OT eval    PRESENT ON ADMISSION:  Are any of the following five conditions present or suspected on admission: decubitus ulcer, infection from an intravascular device, infection due to an indwelling catheter, surgical site infection or pneumonia? No.        Total time I spent in care of this patient today (excluding time spent on other billable services) was 45 minutes. This time does not overlap with other providers involved in this patient's care.    Report Electronically Signed by:   Buzzy Han, NP    Hospital Medicine APP Team    Date: 12/21/2022 Time: 11:55     The assessment and plan was discussed with attending physician Dr. Oleta Mouse, who will co-sign this H&P.     Initially in HHS and overcorrected to become hypoglycemic. Now being closely managed by IGT. Insulin management at home will be complicated given prior L eye  blindness and now R eye vitreous hemorrhage making it logistically very difficult for him to safely administer insulin at home. Ophthalmology also following closely - will contact prior to discharge to help them coordinate follow-up.

## 2022-12-21 NOTE — ED Provider Notes (Signed)
EMERGENCY DEPARTMENT PHYSICIAN NOTE - Kerry Mercado      Date of Service: 12/20/2022  6:55 PM Patient's PCP: Sherrin Daisy, Than   Note Started: 12/21/2022 00:18 DOB: September 11, 1966      Chief Complaint   Patient presents with    Eye Problem       The history provided by the patient.       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 Patient awoke 8 days ago with "cloudy" and "smears of blood" in his right visual field. 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 No pain. Denies trauma or CL wear. Went to [facility] ED 2 days ago, says he had an [place] and exam and they told him he might have a retinal detachment but they weren't sure. He was discharged. He presents to the ED today for further care as symptom(s) have not improved.     POC glucose in triage > 600       HISTORY:  No past medical history on file. Patient Active Problem List    Diagnosis Date Noted    Hyperosmolar hyperglycemic state (HHS) 12/21/2022      No past surgical history on file.   Current Outpatient Medications:     Aspirin 81 mg Chewable Tablet, Take 1 tablet by mouth every day.    Atorvastatin (LIPITOR) 20 mg Tablet, Take 1 tablet by mouth every day.    BASAGLAR KWIKPEN U-100 INSULIN 100 unit/mL (3 mL) Pen, Inject 20 Units subcutaneously every day at bedtime.    LevoTHYROxine 75 mcg Tablet, Take 1 milli-units by mouth every morning before a meal.    Lisinopril (PRINIVIL, ZESTRIL) 5 mg Tablet, Take 1 tablet by mouth every day.    Mycophenolate (MMF) 250 mg Capsule, Take 3 capsules by mouth 2 times daily.    Omeprazole (PRILOSEC) 40 mg Delayed Release Capsule, Take 1 capsule by mouth once daily before a meal.    PROZAC 40 mg Capsule, Take 1 capsule by mouth every day.    Tacrolimus (FK-506) 1 mg Capsule, Take 1 capsule by mouth 2 times daily.      Social History     Social History Narrative     Not on file    No family history on file.     TRIAGE VITAL SIGNS:  Temp: 36.8 C (98.2 F) (12/20/22 1857)  Temp src: Oral (12/20/22 1857)  Pulse: 90 (12/20/22 1857)  BP: (!) 143/72 (12/20/22 1857)  Resp: 20 (12/20/22 1857)   SpO2: 97 % (12/20/22 1857)  Weight: 73.6 kg (162 lb 4.1 oz) (12/20/22 1857)    Physical Exam  Vitals and nursing note reviewed.   Constitutional:       General: He is not in acute distress.     Appearance: He is well-developed. He is not ill-appearing.   HENT:      Head: Normocephalic and atraumatic.      Jaw: There is normal jaw occlusion.      Right Ear: External ear normal.      Left Ear: External ear normal.      Nose: Nose normal.      Mouth/Throat:      Lips: Pink.      Mouth: Mucous membranes are moist.      Pharynx: Oropharynx is clear. No oropharyngeal exudate.  Eyes:      General: Lids are normal. Gaze aligned appropriately. No scleral icterus.     Extraocular Movements: Extraocular movements intact.      Conjunctiva/sclera: Conjunctivae normal.      Pupils: Pupils are unequal.      Left eye: Pupil is not reactive.      Comments: VA:  OD--FC  OS--NLP    Left pupil NR  Right pupil 4-->3   Neck:      Thyroid: No thyromegaly.      Trachea: Trachea and phonation normal.   Cardiovascular:      Rate and Rhythm: Normal rate and regular rhythm.      Pulses:           Radial pulses are 2+ on the right side and 2+ on the left side.        Dorsalis pedis pulses are 2+ on the right side and 2+ on the left side.        Posterior tibial pulses are 2+ on the right side and 2+ on the left side.      Heart sounds: Normal heart sounds. No murmur heard.  Pulmonary:      Effort: Pulmonary effort is normal. No respiratory distress.      Breath sounds: Normal breath sounds and air entry. No stridor.   Abdominal:      General: Bowel sounds are normal. There is no distension.      Palpations: Abdomen is soft.      Tenderness: There is no abdominal tenderness. There is no right CVA tenderness or left CVA  tenderness.   Musculoskeletal:         General: No tenderness or deformity. Normal range of motion.      Cervical back: Normal, normal range of motion and neck supple.      Thoracic back: Normal.      Lumbar back: Normal.      Right lower leg: No edema.      Left lower leg: No edema.   Lymphadenopathy:      Cervical: No cervical adenopathy.   Skin:     General: Skin is warm.      Capillary Refill: Capillary refill takes less than 2 seconds.      Findings: No rash.   Neurological:      Mental Status: He is alert and oriented to person, place, and time.      Cranial Nerves: Cranial nerves 2-12 are intact.      Sensory: Sensation is intact.      Coordination: Coordination is intact.      Gait: Gait is intact.   Psychiatric:         Attention and Perception: Attention normal.         Mood and Affect: Mood normal.         Speech: Speech normal.         Behavior: Behavior normal.         Thought Content: Thought content normal.              MEDICAL DECISION MAKING  Differential includes, but is not limited to: vitreous hemorrhage, retinal detachment, PDR, DKA< HHS     Pertinent lab results:  Labs Reviewed   CBC WITH DIFFERENTIAL - Abnormal       Result Value    White Blood Cell Count 6.4      Red Blood Cell Count 5.07      Hemoglobin 14.1  Hematocrit 43.9      MCV 86.6      MCH 27.8      MCHC 32.1      RDW 14.3      MPV 8.5      Platelet Count 206      Neutrophils % Auto 81.9      Lymphocytes % Auto 11.0      Monocytes % Auto 6.1      Eosinophils % Auto 0.6      Basophils % Auto 0.4      Neutrophils Abs Auto 5.3      Lymphocytes Abs Auto 0.7 (*)     Monocytes Abs Auto 0.4      Eosinophils Abs Auto 0.0      Basophils Abs Auto 0.0     BASIC METABOLIC PANEL - Abnormal    Sodium 125 (*)     Potassium 5.4 (*)     Chloride 88 (*)     Carbon Dioxide Total 24      Anion Gap 13      Urea Nitrogen, Blood (BUN) 29 (*)     Creatinine Serum 1.22 (*)     Glucose 828 (*)     Calcium 8.9      E-GFR Creatinine (Male) 69     BASIC  METABOLIC PANEL - Abnormal    Sodium 127 (*)     Potassium 4.8      Chloride 90 (*)     Carbon Dioxide Total 23      Anion Gap 14      Urea Nitrogen, Blood (BUN) 31 (*)     Creatinine Serum 1.18 (*)     Glucose 742 (*)     Calcium 9.1      E-GFR Creatinine (Male) 72     GLUCOSE - Abnormal    Glucose 714 (*)    POC GLUCOSE - Abnormal    POC GLUCOSE >600 (*)    POC GLUCOSE - Abnormal    POC GLUCOSE >600 (*)    MAGNESIUM (MG) - Normal    Magnesium (Mg) 1.9     PHOSPHORUS (PO4) - Normal    Phosphorus (PO4) 3.5     BASIC METABOLIC PANEL   BASIC METABOLIC PANEL   MAGNESIUM (MG)   MAGNESIUM (MG)   PHOSPHORUS (PO4)   PHOSPHORUS (PO4)   HEMOGLOBIN A1C   POC GLUCOSE   POC GLUCOSE   POC GLUCOSE   POC GLUCOSE   POC GLUCOSE   POC GLUCOSE   POC GLUCOSE   POC GLUCOSE   POC GLUCOSE   POC GLUCOSE   POC GLUCOSE   POC GLUCOSE   POC GLUCOSE   POC GLUCOSE   POC GLUCOSE   POC GLUCOSE   POC GLUCOSE   POC GLUCOSE   POC GLUCOSE   POC GLUCOSE   POC GLUCOSE   POC GLUCOSE   POC GLUCOSE   POC GLUCOSE   POC GLUCOSE   POC GLUCOSE   POC GLUCOSE   POC GLUCOSE   POC GLUCOSE   POC GLUCOSE   POC GLUCOSE   POC GLUCOSE   POC GLUCOSE   POC GLUCOSE   POC GLUCOSE   POC GLUCOSE   POC GLUCOSE   POC GLUCOSE   POC GLUCOSE   POC GLUCOSE   POC GLUCOSE   POC GLUCOSE   POC GLUCOSE   POC GLUCOSE   POC GLUCOSE   POC GLUCOSE   POC GLUCOSE   POC GLUCOSE  POC GLUCOSE   POC GLUCOSE   BLD GAS VENOUS    Patient Temp, Ven        PO2, Ven 39      O2 Sat, Ven 72      PCO2, Ven 49      pH, VEN 7.36      HCO3, Ven 27      Base Excess, Ven 1           Pertinent imaging results (interpreted independently by me):   None    Radiology reads reviewed:       ECG (interpreted independently by me): n/a        PATIENT SUMMARY:     ATTENDING PHYSICIAN MEDICAL DECISION MAKING:    I saw the patient independently.     Throughout the patient's ED course, I performed repeated bedside assessments, monitored ongoing vital signs, evaluated and interpreted the diagnostic data including labs and  imaging, and assessed for response to therapy. The findings of the diagnostic studies performed during this visit were discussed in detail with the patient or their designated representative.    This is a 16yr male with IDDM who presents with painless vision loss to his right eye and hyperglycemia. Differential diagnosis as noted above. Ophthalmology consulted, case discussed, patient examined. Findings consistent with vitreous hemorrhage. They recommend DM control and outpatient follow up in the [facility] in 1 week. Labs notable for hyperglycemia to 828. No evidence of DKA. Potassium mildly elevated to 5.4 and sodium low to 125 all consistent with hyperglycemia. Cr 1.22; unknown baseline. Started intravenous fluids, Lantus, and insulin drip. Monitoring serial POCG and chemistries. Will admit to ED obs for further treatment and monitoring.     Disposition: ADMISSION (Service: ED Obs)          ED Course as of 12/21/22 0025   Fri Dec 20, 2022   2205 White Blood Cell Count: 6.4 [DB]   2205 Hemoglobin: 14.1 [DB]   2205 Hematocrit: 43.9 [DB]   2205 Platelet Count: 206 [DB]   2205 pH, VEN: 7.36 [DB]   2205 PCO2, Ven: 49 [DB]   2205 HCO3, Ven: 27 [DB]   2205 Sodium(!): 125 [DB]   2205 Potassium(!): 5.4 [DB]   2205 Chloride(!): 88 [DB]   2205 Carbon Dioxide Total: 24 [DB]   2205 Anion Gap: 13 [DB]   2205 Urea Nitrogen, Blood (BUN)(!): 29 [DB]   2205 Creatinine Blood(!): 1.22 [DB]   2205 Glucose(!!): 828 [DB]      ED Course User Index  [DB] Linden Dolin, MD       ED Medication Administration through 12/21/2022 0025       Date/Time Order Dose Route Action    12/20/2022 2339 PDT Insulin Regular 100 Units in NaCl 0.9% 100 mL (Adult DKA/HHS - Goal BG 150-200 mg/dL) Infusion 8 Units/hr IV New bag/syringe    12/20/2022 2243 PDT Electrolyte Solution A (PLASMA-LYTE A) Infusion -- IV New bag/syringe    12/20/2022 2341 PDT Insulin Glargine (LANTUS) 100 unit/mL Injection 25 Units 25 Units SUBCUTANEOUS Given            Case discussed  with the follow service(s):  OPHTHALMOLOGY CONSULT  ED [place]  ED [place]   ED Observation Consult Details  Consulted ED Observation Service for observation under the HYPERGLYCEMIA  Pathway.   Diagnosis: HHS   Additional details (why does the patient require admission/additional services involved/etc):poorly controlled DM1 with glucose 828; no acidosis; also with acute  R eye vision changes--Ophthalmology following and will leave recs, suspect DPR or PVH  The plan for this patient includes insulin drip per protocol, intravenous fluids, IV hydration per the order panel,   POC glucose per the order panel, BMP q 4hr, Potassium replacement if patient has hypokalemia (K 5.4 on first chem); consider ED Pharmacy consult if outpatient medication regimen assistance is needed  Consider Glycemic Team/Endocrinology consult   Anticipate admission if: unresolved hyperglycemia after 24h (if glucose not under 200)   Anticipate discharge if: glucose <200, tolerating PO, cleared by Ophthalomogy    Brief details of discussion(s): Discussed admission with consult service.    Disposition: ED Observation    Clinical Impression:    Hyperosmolar hyperglycemic state (HHS)  (primary encounter diagnosis)  DM (diabetes mellitus), type 1, uncontrolled, with hyperosmolarity  Vitreous hemorrhage of right eye  Hyperkalemia      LAST VITAL SIGNS  Temp: 36.8 C (98.2 F) (12/21/22 0006)  Temp src: 1  Pulse: 77 (12/21/22 0006)  Blood Pressure: 143/72 (12/20/22 1857)  Resp Rate: 18 (12/21/22 0006)  SpO2: 97 (12/21/22 0006)  Weight: 73.6 (162 lb 4.1 oz) (12/20/22 1857)    PATIENT'S GENERAL CONDITION:  Serious: Indicators are questionable.                 A medical screening exam was performed.    Electronically signed by: Lilla Shook, MD - Attending Physician

## 2022-12-21 NOTE — ED Nursing Note (Signed)
Patient report given to Kelby Aline Agricultural consultant. Patient VSS, to be transferred by gurney with all belongings.

## 2022-12-21 NOTE — Consults (Addendum)
Inpatient Glycemic Team Consult Note  Consulting Service:  Endocrinology  HD: 0     Name:Kerry Mercado  MRN: [id number]  DOB: [birth year]    Note Started: 12/21/2022 09:21 Date of Service: 12/21/2022   Date of Admission: 12/20/2022 Requesting Attending: No ref. 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  Inpatient Glycemic Team was consulted for glycemic management.    Patient was referred to the ED at [facility] with chief complaint of R eye vision loss x 1 week and was noted to have elevated serum glucose of 828 mg/dl with associated  normal pH of 7.36; anion gap of 13 and calculated serum osmolality of 306 mOsm/kg.   Patient was given a basal Lantus of 25 units (~ 2341 at [facility] 12/20/22) with initiation of insulin infusion followed by IVF D5/ 0.45NaCl at 200 ml/hr. Insulin infusion was discontinued (~ 0430 on 12/21/22). Noted POC glucose of 75mg /dl this am (12/21/22 ~ 0745) with dextrose containing IVF ongoing with subsequent change in IVF to D10 /0.45 NaCl at 150 cc/hr.  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 Patient stated that on initial diagnosis of diabetes, he was started on "pills x 2 days" then switched over to insulin injections there after. He vaguely recalls having "a lot of  blood work" done at that time of diagnosis.  Patient reported that he typically takes Lantus 20 units BID with his last self administration of Lantus of 20 units on 12/20/22 in am. He reported being consistent with his basal insulin twice daily "9 am and 9 pm". He stated that he was started on basal insulin twice daily for 2 years now; he stated he was told to do basal insulin once a day but he reported "a lot of lows" with once daily insulin of 40 units. He stated that he was consistent with his meal time insulin and may miss a dose once a month.  He stated that he has symptoms of hypoglycemia (feels "not clear" in the head) at least once a week with resolution of symptoms with juice or food. He stated that he does POC glucose checks 3-4 x a day with typical readings in the 200s mg/dl. However, due to recent vision loss in his right eye, he was not able to do POC glucose checks in the last couple of days. He was able to continue self administering his insulin by counting the number of clicks (corresponding to his insulin dose) on his insulin pen.  He stated that he lives alone and has been able to do his own grocery shopping and cooking. For medical transport for follow up visits, he typically relies on a "taxi" or family as he does not drive. For his routine oral medications, he normally fills up his pill box for 5 days   Diabetes Type  1 x 33 years. 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 (0000000)  Diabetic complications: retinopathy and peripheral neuropathy   Followed by PCP  in [place] - last seen " a few months ago"  Last seen by Endo in [place] (patient cannot recall name) - Jan 2024     Current Inpatient Insulin Regimen:  Basal: Glargine (Lantus)  25  units SQ qHS  Nutritional: Aspart (Novolog) None  Correctional: Average Correctional Aspart (Novolog) Scale SQ TID AC  Total Daily Dose of Insulin (TDDI) in the last 24 hours: ~ 25+ (total of 23 units from insulin infusion)  units  IVF of  D5 /0.45NaCl infusion at 200cc/hr -subsequent changed to D10 /0.45 NaCl at 150cc/hr  Recent labs for the past 24 hours     12/20/22  1903 12/20/22  1954 12/20/22  2228 12/20/22  2236 12/20/22  2251 12/21/22  0041 12/21/22  0203 12/21/22  0302 12/21/22  0305 12/21/22  0337 12/21/22  0407 12/21/22  0506 12/21/22  0616 12/21/22  0745 12/21/22  0754 12/21/22  0845   PGLU >600*  --  >600*  --   --  461* 280*  --  145* 124* 104* 113* 89 72  --  81   GLU  --  828*  --  742* 714*  --   --  188*  --   --   --   --   --   --  80  --       Last 24 Hour Range: 72 - 600      Review of Systems     Review of Systems   Constitutional:  Positive for unexpected weight change (loss of ~ 5 lbs over a month).   HENT: Negative.     Eyes:  Positive for visual disturbance.        Left eye blindness   Respiratory: Negative.     Cardiovascular: Negative.    Gastrointestinal: Negative.    Endocrine: Positive for polydipsia (depending if "sugars are high") and polyuria (depending if "sugars are high").   Genitourinary: Negative.    Musculoskeletal: Negative.    Skin:  Positive for wound (R second toe).   Allergic/Immunologic: Negative.    Neurological: Negative.    Hematological: Negative.    Psychiatric/Behavioral: Negative.         Home DM Regimen: Diabetes Home Regimen:  On 12/21/22 reported taking:  Lantus 20 units SQ BID (last dose was on 12/20/22 in am prior to admission)  Meal time insulin of 15 units TID [facility]    Per chart review  Office Visit 10/25/19:   Basaglar 20 units SQ qHS  Humalog 15 units for Breakfast; 18 units for Lunch; 18 units for dinner;   12 units for HS snacks  Plus a scale with meals    DC Summary from [facility] (11/2018):-  Lantus 15 units SQ daily  Meal time Insulin:  Breakfast -15 units  Lunch - 18 units  Dinner - 18 units  And Snack coverage - 12 units  Correctional scale of 3 units for every 50mg /dl above 150mg /dl    -POC Glucose Checks: 3-4x a day; usual readings in the 200s  range  -Hypoglycemia events: at least once a week  --Tx of hypoglycemia is with food; juice.  --Pt has needed emergency assistance for hypoglycemia in the past: denies   Inpatient Medications   Scheduled:     Fluoxetine (PROZAC) Capsule/Tablet 40 mg  Insulin Aspart (NOVOLOG) Injection Pen 1-20 Units    Insulin Glargine (LANTUS) 100 unit/mL Injection 25 Units    LevoTHYROxine Tablet 75 mcg    Mycophenolate (MMF) (CELLCEPT) Capsule 750 mg    Pantoprazole (PROTONIX) Delayed Release Tablet 40 mg    Tacrolimus (FK-506) (PROGRAF) Capsule 1 mg    PRN:     Dextrose 10% 10-20 g IVPB 100-200 mL    Glucagon Injection 1 mg    Glucose Chewable Tablet 12-24 g    Continuous:     D10 / 0.45% NaCl Infusion    [Held by provider] D5 / 0.45% NaCl Infusion     History:   Past Medical History:  has a past medical history of DM2 (diabetes mellitus, type 2), HTN (hypertension), Hyperlipemia, and Hypothyroidism. Left eye blindness   Family History: Sister: + DM (type 2)      Social History:  Marijuana use: Daily  Tobacco Use; 2 pp per week  ETOH: occasional        Allergies: Shellfish derived      Objective   Objective        Vitals:  Vital Signs have been reviewed   Most Recent:BP (!) 148/81   Pulse 68   Temp 36.9 C (98.4 F)   Resp 15   Wt 73.6 kg (162 lb 4.1 oz)   SpO2 98%     Results: The following results have been reviewed:     Labs - Past 72 Hours     12/21/22  0754   CREATININE BLOOD 0.92   GLUCOSE 80   UREA NITROGEN, BLOOD (BUN) 21*   SODIUM 140   POTASSIUM 3.4   CHLORIDE 104   CARBON DIOXIDE TOTAL 28   CALCIUM 8.8        Intake and Output Last Shift:  No intake/output data recorded.    I have reviewed the Glycemic Management Summary and have noted the following blood glucose trends:    Physical Exam  Vitals and nursing note reviewed.   Constitutional:       Appearance: Normal appearance.   HENT:      Head: Normocephalic and atraumatic.      Mouth/Throat:      Mouth: Mucous membranes are moist.      Pharynx: Oropharynx is  clear.   Eyes:      Conjunctiva/sclera: Conjunctivae normal.   Cardiovascular:      Rate and Rhythm: Normal rate and regular rhythm.      Pulses:           Dorsalis pedis pulses are 1+ on the right side and 1+ on the left side.      Heart sounds: Normal heart sounds.   Pulmonary:      Effort: Pulmonary effort is normal.      Breath sounds: Normal breath sounds.   Musculoskeletal:         General: Normal range of motion.      Right foot: Deformity present.      Left foot: Deformity present.        Feet:    Feet:      Right foot:      Skin integrity: Ulcer present.      Toenail Condition: Right toenails are normal.      Left foot:      Skin integrity: Skin integrity normal.      Toenail Condition: Left toenails are normal.      Comments: Hammer claw to toes  R 2nd  toe with ulceration    Skin:     General: Skin is warm and dry.   Neurological:      Mental Status: He is alert. Mental status is at baseline.           Assessment and Plan   57 year old male with Type 1 diabetes uncontrolled (HgbA1c pending, previous A1c per chart review of 11% in 11/2018) admitted for Right eye Vitreous Hemorrhage and severe hyperglycemia (initial glucose of 828 mg/dl; pH 7.36) approaching HHS with initial calculated serum osmolality of 306 mOsm/kg.   Would recommend decreasing basal dose using  a calculation of weight (kilos) with factor of 0.4 with  50% as basal ~ 15 units.  Typically, patients with type 1 diabetes would need exogenous insulin at all times including meal times; however, would hold off on meal time insulin at this time as patient may have had too much basal insulin on board. Patient with symptomatic hypoglycemia (POC glucose of 75mg /dl) even with IVF D5 0.45NaCl infusing at 200cc/hr ( subsequently changed ot IVF D10 0.45 NaCl at 150cc/hr).  Addendum @ 1455:  Patient reported that he had some eggs for breakfast; Now, having his lunch with pre-lunch POC glucose of 252mg /dl ( novolog 3 units given per sensitive correctional  scale), and IVF containing dextrose DC.  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  Currently the Patient's nutrition Rx is: 60-75 gms carb/meal (recommended for men)   Current Diet Order:   PO Diet    DIABETIC DIET - MALE   Recommendations (at 1455):  - Continue with Basal Insulin Glargine (Lantus)  15  units SQ qHS  - Add Nutritional Aspart (Novolog) 6 units SQ TID AC   - Change to Average Correctional Aspart (Novolog) Scale SQ TID AC   - Add bedtime qHS (1-4 units) prn correctional scale     Recommendation:  - Decrease Basal Insulin Glargine (Lantus) 15  units SQ qHS  - Nutritional Aspart (Novolog) none at this time  - Change to Sensitive Correctional Aspart (Novolog) Scale SQ TID AC     If NPO:  - decrease current basal insulin dose by 20%  - hold scheduled Nutritional Novolog   - change current Correctional scale to q 4hours     Preliminary Discharge Recommendations:  Basaglar (Glargine) pen Or Covered Alternative  Aspart (Novolog) pen Or Covered Alternative  Inhaled Glucagon (Baqsimi) prn hypoglycemia  Establish and Follow up with for :  - further diabetes medication dose adjustments  - referral to Endocrinology ( interested in continuous glucose monitoring =CGM sensor and insulin pump) - was previously on an insulin pump and CGM (last time ~ 2012) prior to pancreas transplant     The above recommendations were given to the (A) [place] Service service to optimize the patient's glycemic regimen.  Patient was seen and case discussed with Endocrinology attending Dr. Samule Dry and the above plan was formulated in part with them, who agreed with above recommendations.  Total time I spent in care of this patient today (excluding time spent on other billable  services) was  91  minutes. This time does not overlap with other providers involved in this patient's care.    Alto Denver, NP  Inpatient Glycemic Team  [employer] ([phone])"Inpatient Glycemic Team"  (After hours- Endo Fellow On Call)     Inpatient Glycemic Team (Endocrinology) Consult  Consult performed by: Kirstie Mirza, NP  Consult ordered by: Cristobal Goldmann, NP

## 2022-12-21 NOTE — Consults (Signed)
Patient has orders for admission and no ED HN services indicated at this time. If patient is in need of future care coordination, please leverage [facility] inpatient support.     If patient is not admitted please re-consult ED Health Navigator.     Electronically signed by:  Alinda Deem  [place] or  [facility] Text

## 2022-12-21 NOTE — Progress Notes (Signed)
[x]  Patient was CLEARED for dilation. Dilating drops were instilled in both eyes on 12/21/2022.   [x]  Nursing staff was notified after dilating drops were given and instructed to place a sign.      Ophthalmology Progress Note    Reason for Consult: c/f vitreous hemorrhage, right eye   Requesting Attending: Linden Dolin, MD  Location of Service: Emergency Department    Identification/Chief Complaint/History of Present Illness:     Kerry Mercado is a 57yr old male with a history of diabetic retinopathy status post retinopexy both eyes, neovascular glaucoma and blindness left eye who presents with severely decreased vision in right eye, consult for c/f vitreous hemorrhage.     Patient reports that he woke up 8 days ago and his right eye vision was suddenly severely decreased. He describes it as a red fog; he is able to see it through it but his vision is now much more limited. He also sees some spots. Denies flashes, dark curtain/shadow.    Of note, he went to the [facility] ED about 5 days ago after talking to an advice nurse about his vision symptoms. Their examination was concerning for eye ultrasound findings that could be indicative of retinal detachment. Patient was attempted to be seen in clinic, but given difficult transportation, patient was instructed to come to [facility] ED.    Patient likely to be in the ED observation unit since he was noted to have blood glucose level in the 800s, currently on insulin drip.    Interval History  12/21/22: Patient admitted to internal medicine for hyperglycemia. He reports the vision is still quite blurry in the right eye.     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  Status post pancreas transplant c/b rejection    Medications:    Current Facility-Administered Medications:     Acetaminophen (TYLENOL) Tablet 650 mg, 650 mg, ORAL, Q4H PRN, Cyril Loosen, NP    Bisacodyl (DULCOLAX) EC Tablet 10 mg, 10 mg, ORAL, Q24H PRN, Meda Coffee, Ardeth Sportsman, NP    Camphor/Menthol (SARNA) Lotion, , TOPICAL, TID PRN, Cyril Loosen, NP    Capsaicin (ZOSTRIX) 0.025 % Cream, , TOPICAL, BID prn, Cyril Loosen, NP    D10 / 0.45% NaCl Infusion, , IV, CONTINUOUS, Alveta Heimlich Lew Dawes, NP, Last Rate: 150 mL/hr at 12/21/22 0835, New Bag at 12/21/22 0835    [Held by provider] D5 / 0.45% NaCl Infusion, , IV, CONTINUOUS, Cristobal Goldmann, NP, Stopped at 12/21/22 0815    Dextrose 10% 10-20 g IVPB 100-200 mL, 10-20 g, IV, PRN, Enid Derry, NP    Diphenhydramine-Zinc Acetate (BENADRYL ITCH-RELIEF) 1-0.1 % Cream, , TOPICAL, TID PRN, Cyril Loosen, NP    Fluoxetine (PROZAC) Capsule/Tablet 40 mg, 40 mg, ORAL, QAM, Enid Derry, NP, 40 mg at 12/21/22 0910    Glucagon Injection 1 mg, 1 mg, IM, PRN, Enid Derry, NP    Glucose Chewable Tablet 12-24 g,  12-24 g, ORAL, PRN, Enid Derry, NP    Insulin Aspart (NOVOLOG) Injection Pen 1-20 Units, 1-20 Units, SUBCUTANEOUS, TID w/ meals, Cyril Loosen, NP, 3 Units at 12/21/22 1310    Insulin Glargine (LANTUS) 100 unit/mL Injection 15 Units, 15 Units, SUBCUTANEOUS, Daily Bedtime Insulin, Cyril Loosen, NP    LevoTHYROxine Tablet 75 mcg, 75 mcg, ORAL, QAM AC, Enid Derry, NP, 75 mcg at 12/21/22 [id number]    Melatonin Tablet 3 mg, 3 mg, ORAL, Bedtime PRN, Cyril Loosen, NP    Mycophenolate (MMF) (CELLCEPT) Capsule 750 mg, 750 mg, ORAL, BID, Enid Derry, NP, 750 mg at 12/21/22 0910    Ondansetron (ZOFRAN) Injection 4 mg, 4 mg, IV, Q8H PRN, Cyril Loosen, NP    Pantoprazole (PROTONIX) Delayed  Release Tablet 40 mg, 40 mg, ORAL, QAM AC, Enid Derry, NP, 40 mg at 12/21/22 [id number]    Polyethylene Glycol 3350 (MIRALAX) Oral Powder Packet 17 g, 17 g, ORAL, QAM, Cyril Loosen, NP    Prochlorperazine (COMPAZINE) Injection 5-10 mg, 5-10 mg, IV, Q6H PRN, Cyril Loosen, NP    Tacrolimus (FK-506) (PROGRAF) Capsule 1 mg, 1 mg, ORAL, BID, Enid Derry, NP, 1 mg at 12/21/22 [id number]    Current Outpatient Medications:     Aspirin 81 mg Chewable Tablet, Take 1 tablet by mouth every day., Disp: , Rfl:     Atorvastatin (LIPITOR) 20 mg Tablet, Take 1 tablet by mouth every day., Disp: , Rfl:     BASAGLAR KWIKPEN U-100 INSULIN 100 unit/mL (3 mL) Pen, Inject 20 Units subcutaneously every day at bedtime., Disp: , Rfl:     LevoTHYROxine 75 mcg Tablet, Take 1 milli-units by mouth every morning before a meal., Disp: , Rfl:     Lisinopril (PRINIVIL, ZESTRIL) 5 mg Tablet, Take 1 tablet by mouth every day., Disp: , Rfl:     Mycophenolate (MMF) 250 mg Capsule, Take 3 capsules by mouth 2 times daily., Disp: , Rfl:     Omeprazole (PRILOSEC) 40 mg Delayed Release Capsule, Take 1 capsule by mouth once daily before a meal., Disp: , Rfl:     PROZAC 40 mg Capsule, Take 1 capsule by mouth every day., Disp: , Rfl:     Tacrolimus (FK-506) 1 mg Capsule, Take 1 capsule by mouth 2 times daily., Disp: , Rfl:     Allergies:  Allergies   Allergen Reactions    Shellfish Derived Anaphylaxis       Images:    B scan right eye 12/20/2022:          Ophthalmology Exam:    Base Eye Exam       Visual Acuity (Snellen - Linear)         Right Left    Dist sc CF at 17F NLP              Tonometry (iCare, 1:18 PM)         Right Left    Pressure 19 72              Pupils         Dark React    Right 8 pharm dil    Left 8 pharm dil                  Slit Lamp and Fundus Exam       External Exam         Right Left    External Normal Normal  Slit Lamp Exam         Right Left    Lids/Lashes Normal Normal    Conjunctiva/Sclera White and Quiet White and  Quiet    Cornea Clear Clear    Anterior Chamber Deep and Quiet Deep and Quiet    Iris no neovascularization no neovascularization    Lens 2+ Nuclear sclerosis 2+ Nuclear sclerosis    Anterior Vitreous Vitreous hemorrhage Normal              Fundus Exam         Right Left    Disc Hazy, grossly perfused Pallor    Macula Hazy, flat/attached Normal, Flat, No Heme    Vessels Hazy behind vit heme Normal    Periphery 360 PRP 360 PRP                    --------------------------------------------------------------------------------------------------------------------------------------    Impression:  Vitreous Hemorrhage, right eye  2. Long standing proliferative diabetic retinopathy status post laser, both eyes  3. History of neovascular glaucoma, left eye (NLP)    - Patient with acute onset of red fog 8 days prior to presentation (floaters, "dark haze", flashing lights)  - Dilated exam showed diffusely hazy view but able to visualize retina flat/attached, 360 PRP scars  - B-scan ultrasound showed vitreous hemorrhage without retinal detachment   - Most likely etiology is diabetic retinopathy in the setting of uncontrolled diabetes  - Recommend minimal activity, sleep with head of bed elevated, avoid non medically necessary blood thinners and NSAIDs  - Patient will likely need treatment with anti-VEGF injection and/or more laser, which are treatments that would be administered in clinic. We will continue to follow and coordinate if patient continues to be admitted.     - Welcome to follow-up at [facility] if his insurance is accepted or with preferred provider. Please place a referral. We will send a staff message to our scheduling team.   [phone]   [facility]. [place], Op[facility]mology Clinic (1st floor)   [place], [place] [address]    Patient will be staffed with Dr. Dorothy Puffer this afternoon.    Thank you for this interesting consult. Please page ophthalmology on-call [phone]) with  any questions or concerns, or if no longer clear for dilation. If using TigerText or Secure Chat, please check the Admin Notes under 1stCall on On-Call for the appropriate on-call resident.    Maxie Better, MD  PGY-2 Resident  [facility] [facility] Ophthalmology  Pager: [phone]  Available on [facility] Text

## 2022-12-21 NOTE — Progress Notes (Signed)
ED Observation Note    This patient was signed out to me at [phone] by Sharrie Rothman, NP.      ED Observation Unit Re-Evaluation Note    Date: 12/21/2022  Time: 06:13    Latest Vitals  Temp: 36.6 C (97.9 F) (12/21/22 0510)  Temp src: Oral (12/21/22 0510)  Pulse: 75 (12/21/22 0510)  BP: (!) 138/94 (12/21/22 0510)  Resp: 12 (12/21/22 0510)  SpO2: 98 % (12/21/22 0510)  Weight: 73.6 kg (162 lb 4.1 oz) (12/20/22 1857)        Subjective:  No new complaints, though continues to have labile glycemic status    Objective:   General Appearance: healthy, alert, no distress, pleasant affect, cooperative, skin warm, dry, and pink  Heart: normal rate and regular rhythm, no murmurs, clicks, or gallops  Lungs: clear to auscultation  Neuro: Gait normal. Reflexes normal and symmetric. Sensation and strength grossly normal  Mental Status: Appearance/Cooperation: oriented times 3    Plan:  1. Hyperglycemia/HHS  R Eye Vision Changes / R Eye Vitreous Hemorrhage  -Serial Exams  -Insulin Protocol   -Maint IV fluids  -BMP q4h   -Glycemic Team Consult  -PO Challenge  -Health Navigator Consult for PRESS Pathway - done   -Outpatient Referral to [facility] - done   -Ophthalmology Recommendations   Impression:  Vitreous Hemorrhage, right eye  Long standing diabetic retinopathy status post retinopexy, both eyes     - Patient with acute onset of red fog 8 days ago (floaters, "dark haze", flashing lights)  - Dilated exam showed diffusely hazy view  - B-scan ultrasound showed vitreous hemorrhage without retinal detachment   - Most likely etiology is diabetic retinopathy in the setting of uncontrolled diabetes     - Recommend obtaining repeat A1c level - ordered  - Glycemic control per ED and/or primary team  - Recommend minimal activity, sleep with head of bed elevated, avoid non medically necessary blood thinners and NSAIDs  - Return precautions discussed and emergency line reiterated  - Upon discharge, patient should follow-up with the Retina Service in 1  week  - Recommend lensometer reading of spectacles at that time to determine degree of myopia. Once vit heme has resolved should establish care with our retina service   - Welcome to follow-up at [facility] if his insurance is accepted or with preferred provider. Please place a referral. We will send a staff message to our scheduling team.               [phone]               [facility]. [place], Ophthalmology Clinic (1st floor)               [place], [place] [address]  - Please place ED health navigator consult as urgent to activate PRESS pathway        Chronic Problems  HTN  HLD  Hypothyroidism  Renal and Pancreas Transplant   -cont home meds.  -hold ASA for now.     Patient remains clinically stable with stable vital signs. Observation in the emergency department for this condition is still required to determine if the patient needs admission or can be safely discharged home.      ED Observation Unit Admit Note  Date: 12/21/2022  Time: 08:59  Observation ended at 08:59    Latest  Vitals  Temp: 36.8 C (98.2 F) (12/21/22 0802)  Temp src: Oral (12/21/22 0802)  Pulse: 80 (12/21/22 0802)  BP: 108/70 (12/21/22 0802)  Resp: 15 (12/21/22 0802)  SpO2: 98 % (12/21/22 0802)  Weight: 73.6 kg (162 lb 4.1 oz) (12/20/22 1857)    Patient re-evaluated and requires admission.    Subjective:  No change from prior    Objective:   General Appearance: healthy, alert, no distress, pleasant affect, cooperative, skin warm, dry, and pink, cooperative  Heart: normal rate and regular rhythm, no murmurs, clicks, or gallops  Lungs: clear to auscultation  Mental Status: Appearance/Cooperation: oriented times 3    Observation Care Course  HHS on dextrose drip. IGT saw patient and recommends admission due to continued need for dextrose IV + patient unable to see due to vitreous hemorrhage in remaining good eye and cannot administer shots himself. Unlikely to resolve in 24 hours. Patient  admitted to hospital medicine for further management    Clinical Impression:     ICD-10-CM    1. Hyperosmolar hyperglycemic state (HHS)  E11.00 PLACE IN ED OBSERVATION     PLACE IN ED OBSERVATION      2. DM (diabetes mellitus), type 1, uncontrolled, with hyperosmolarity  E10.69     E10.65     E87.0       3. Vitreous hemorrhage of right eye  H43.11 Eye Clinic Referral      4. Hyperkalemia  E87.5           Observation ended at 08:59 on 12/21/2022    Disposition:  Admit to hospital medicine    Total time I spent in care of this patient today (excluding time spent on other billable services) was 45 minutes. This time does not overlap with other providers involved in this patient's care.        PATIENT'S GENERAL CONDITION:  Fair: Vital signs are stable and within normal limits. Patient is conscious but may be uncomfortable. Indicators are favorable.       Electronically signed by:  Janey Greaser, NP  Emergency Medicine

## 2022-12-21 NOTE — ED Nursing Note (Signed)
Report given to Shay, RN.

## 2022-12-22 ENCOUNTER — Observation Stay (HOSPITAL_BASED_OUTPATIENT_CLINIC_OR_DEPARTMENT_OTHER): Payer: Medicare Other

## 2022-12-22 DIAGNOSIS — H5440 Blindness, one eye, unspecified eye: Secondary | ICD-10-CM

## 2022-12-22 DIAGNOSIS — N186 End stage renal disease: Secondary | ICD-10-CM

## 2022-12-22 DIAGNOSIS — E1022 Type 1 diabetes mellitus with diabetic chronic kidney disease: Secondary | ICD-10-CM

## 2022-12-22 DIAGNOSIS — H409 Unspecified glaucoma: Secondary | ICD-10-CM

## 2022-12-22 DIAGNOSIS — Z4822 Encounter for aftercare following kidney transplant: Secondary | ICD-10-CM

## 2022-12-22 LAB — BASIC METABOLIC PANEL
Anion Gap: 9 mmol/L (ref 7–15)
Calcium: 7.6 mg/dL — ABNORMAL LOW (ref 8.6–10.0)
Carbon Dioxide Total: 24 mmol/L (ref 22–29)
Chloride: 102 mmol/L (ref 98–107)
Creatinine Serum: 1.06 mg/dL (ref 0.51–1.17)
E-GFR Creatinine (Male): 81 mL/min/{1.73_m2}
Glucose: 379 mg/dL — ABNORMAL HIGH (ref 74–109)
Potassium: 3.9 mmol/L (ref 3.4–5.1)
Sodium: 135 mmol/L — ABNORMAL LOW (ref 136–145)
Urea Nitrogen, Blood (BUN): 15 mg/dL (ref 6–20)

## 2022-12-22 LAB — CBC NO DIFFERENTIAL
Hematocrit: 40.1 % — ABNORMAL LOW (ref 41.0–53.0)
Hemoglobin: 13.7 g/dL (ref 13.5–17.5)
MCH: 28.9 pg (ref 27.0–33.0)
MCHC: 34 % (ref 32.0–36.0)
MCV: 84.9 fL (ref 80.0–100.0)
MPV: 8.9 fL (ref 6.8–10.0)
Platelet Count: 174 10*3/uL (ref 130–400)
RDW: 14.1 % (ref 0.0–14.7)
Red Blood Cell Count: 4.73 10*6/uL (ref 4.50–5.90)
White Blood Cell Count: 7.4 10*3/uL (ref 4.5–11.0)

## 2022-12-22 LAB — POC GLUCOSE
POC GLUCOSE: 358 mg/dL — ABNORMAL HIGH (ref 70–99)
POC GLUCOSE: 399 mg/dL — ABNORMAL HIGH (ref 70–99)
POC GLUCOSE: 76 mg/dL (ref 70–99)
POC GLUCOSE: 130 mg/dL — ABNORMAL HIGH (ref 70–99)
POC GLUCOSE: 207 mg/dL — ABNORMAL HIGH (ref 70–99)

## 2022-12-22 LAB — MAGNESIUM (MG): Magnesium (Mg): 1.7 mg/dL (ref 1.6–2.4)

## 2022-12-22 LAB — HEMOGLOBIN A1C
Hgb A1C,Glucose Est Avg: 318 mg/dL
Hgb A1C: 12.7 % — ABNORMAL HIGH (ref 3.9–5.6)

## 2022-12-22 MED ORDER — INSULIN GLARGINE 100 UNIT/ML SUB-Q VIAL
10.0000 [IU] | Freq: Once | SUBCUTANEOUS | Status: AC
Start: 2022-12-22 — End: 2022-12-22
  Administered 2022-12-22: 10 [IU] via SUBCUTANEOUS

## 2022-12-22 MED ORDER — INSULIN ASPART (U-100) 100 UNIT/ML (3 ML) SUBCUTANEOUS PEN
1.0000 [IU] | PEN_INJECTOR | Freq: Three times a day (TID) | SUBCUTANEOUS | Status: DC
Start: 2022-12-22 — End: 2022-12-22
  Administered 2022-12-22: 10 [IU] via SUBCUTANEOUS

## 2022-12-22 MED ORDER — INSULIN ASPART (U-100) 100 UNIT/ML (3 ML) SUBCUTANEOUS PEN
1.0000 [IU] | PEN_INJECTOR | SUBCUTANEOUS | Status: DC
Start: 2022-12-22 — End: 2022-12-23
  Administered 2022-12-22: 2 [IU] via SUBCUTANEOUS
  Administered 2022-12-23: 5 [IU] via SUBCUTANEOUS
  Administered 2022-12-23: 3 [IU] via SUBCUTANEOUS

## 2022-12-22 MED ORDER — ATORVASTATIN 10 MG TABLET
10.0000 mg | ORAL_TABLET | Freq: Every day | ORAL | Status: DC
Start: 2022-12-22 — End: 2022-12-27
  Administered 2022-12-22 – 2022-12-26 (×5): 10 mg via ORAL
  Filled 2022-12-22 (×5): qty 1

## 2022-12-22 NOTE — Care Plan (Signed)
Problem: Adult Inpatient Plan of Care  Goal: Plan of Care Review  Outcome: Ongoing, Progressing  Flowsheets (Taken 12/22/2022 0355)  Plan of Care Reviewed With: patient  Progress: no change  Outcome Evaluation: Pt A&Ox4, calm and cooperative w/ care. VSS on RA, afebrile. No c/o pain during the night. Pt able to walk but needs assistance/guidance due to impaired vision to both eyes. Left eye is blind per pt, while right eye is blurred vision. Pt given urinal to void. No BM this shift. NAD noted throughout the night. Will continue to monitor.

## 2022-12-22 NOTE — Nurse Assessment (Signed)
ASSESSMENT NOTE    Note Started: 12/22/2022, 07:00     Initial assessment completed and recorded in EMR.  Report received from day shift nurse and orders reviewed. Plan of Care reviewed and appropriate, discussed with patient.  Hosie Spangle, RN

## 2022-12-22 NOTE — Nurse Focus (Incomplete)
Notified Personius, MD that about patients blood sugar = 76. Would like a recheck

## 2022-12-22 NOTE — Care Plan (Signed)
Problem: Adult Inpatient Plan of Care  Goal: Plan of Care Review  Outcome: Ongoing, Progressing  Flowsheets (Taken 12/22/2022 1348)  Plan of Care Reviewed With: patient  Progress: no change  Outcome Evaluation: A&Ox4. Left eye blind and right eye has blurry vision. Ambulates to the restroom with stand by assist. MD and NP aware of patients blood glucose results. US renal completed. Plan to continue to monitor blood glucose.

## 2022-12-22 NOTE — Nurse Assessment (Signed)
ASSESSMENT NOTE    Note Started: 12/22/2022, 21:45     Initial assessment completed and recorded in EMR.  Report received from day shift nurse and orders reviewed. Plan of Care reviewed and appropriate, discussed with patient.  Neva Seat, RN RN

## 2022-12-22 NOTE — Progress Notes (Signed)
INTERNAL MEDICINE DAILY PROGRESS NOTE    Date: 12/22/2022 Time: 17:50    CHIEF COMPLAINT  Blurry vision     INTERVAL  -presented to ED w/ blury vision and inability to take insulin   - found to be in HHS.   - labile blood sugars; IGT consulted     SUBJECTIVE  - feels well, felling better since admission   - states he feels he can give insulin if he get glucometer that can read aloud his sugars  - able to give insulin dose based on number of clicks.     OBJECTIVE    Vitals Signs  Current Vitals  Temp: 36.6 C (97.8 F)  BP: 130/80  Pulse: 83  Resp: 14  SpO2: 98 %     Weight: 73.9 kg (163 lb)    Intake and Output  Last Two Completed Shifts  In: 831.3 [Oral:240; Crystalloid:591.3]  Out: 550 [Urine:550]    Current Shift  In: 600 [Oral:600]  Out: -     PHYSICAL EXAM     Physical Exam  Constitutional:       Comments: Anxious appearing   HENT:      Head: Normocephalic and atraumatic.   Eyes:      General: No scleral icterus.        Right eye: No discharge.         Left eye: No discharge.      Extraocular Movements: Extraocular movements intact.      Conjunctiva/sclera: Conjunctivae normal.   Cardiovascular:      Rate and Rhythm: Normal rate and regular rhythm.      Heart sounds: No murmur heard.     No friction rub. No gallop.   Pulmonary:      Effort: Pulmonary effort is normal. No respiratory distress.      Breath sounds: No stridor. No wheezing, rhonchi or rales.   Abdominal:      General: There is no distension.      Palpations: Abdomen is soft.      Tenderness: There is no abdominal tenderness. There is no guarding.   Musculoskeletal:      Right lower leg: No edema.      Left lower leg: No edema.   Skin:     General: Skin is warm and dry.      Coloration: Skin is not jaundiced.      Comments: No rash to exposed skin   Neurological:      Mental Status: He is alert.      Comments: Moving all extremities spontaneously. Alert and oriented to self and situation          Labs and Studies  I have reviewed the following  information from the last 24 hours: allied health and treating physician notes, imaging, and labs and microbiology data          Lab Results - 48 hours (excluding micro and POC)   POC GLUCOSE ONCE     Status: Abnormal   Result Value Status    POC GLUCOSE >600 (OsHi) Final   CBC with Differential     Status: Abnormal   Result Value Status    White Blood Cell Count 6.4 Final    Red Blood Cell Count 5.07 Final    Hemoglobin 14.1 Final    Hematocrit 43.9 Final    MCV 86.6 Final    MCH 27.8 Final    MCHC 32.1 Final    RDW 14.3 Final  MPV 8.5 Final    Platelet Count 206 Final    Neutrophils % Auto 81.9 Final    Lymphocytes % Auto 11.0 Final    Monocytes % Auto 6.1 Final    Eosinophils % Auto 0.6 Final    Basophils % Auto 0.4 Final    Neutrophils Abs Auto 5.3 Final    Lymphocytes Abs Auto 0.7 (L) Final    Monocytes Abs Auto 0.4 Final    Eosinophils Abs Auto 0.0 Final    Basophils Abs Auto 0.0 Final   Basic Metabolic Panel     Status: Abnormal   Result Value Status    Sodium 125 (L) Final    Potassium 5.4 (H) Final    Chloride 88 (L) Final    Carbon Dioxide Total 24 Final    Anion Gap 13 Final    Urea Nitrogen, Blood (BUN) 29 (H) Final    Creatinine Serum 1.22 (H) Final    Glucose 828 (Crth) Final    Calcium 8.9 Final    E-GFR Creatinine (Male) 64 Final   Bld Gas Venous     Status: None   Result Value Status    Patient Temp, Ven  Final    PO2, Ven 39 Final    O2 Sat, Ven 72 Final    PCO2, Ven 49 Final    pH, VEN 7.36 Final    HCO3, Ven 27 Final    Base Excess, Ven 1 Final   POC GLUCOSE Q4H     Status: Abnormal   Result Value Status    POC GLUCOSE >600 (OsHi) Final   Basic Metabolic Panel     Status: Abnormal   Result Value Status    Sodium 127 (L) Final    Potassium 4.8 Final    Chloride 90 (L) Final    Carbon Dioxide Total 23 Final    Anion Gap 14 Final    Urea Nitrogen, Blood (BUN) 31 (H) Final    Creatinine Serum 1.18 (H) Final    Glucose 742 (Crth) Final    Calcium 9.1 Final    E-GFR Creatinine (Male) 72 Final    Magnesium (Mg)     Status: Normal   Result Value Status    Magnesium (Mg) 1.9 Final   Phosphorus (PO4)     Status: Normal   Result Value Status    Phosphorus (PO4) 3.5 Final   Glucose     Status: Abnormal   Result Value Status    Glucose 714 (Crth) Final   POC GLUCOSE Q1H     Status: Abnormal   Result Value Status    POC GLUCOSE 461 (H) Final   Hemoglobin A1C     Status: Abnormal   Result Value Status    Hgb A1C 12.7 (H) Final    Hgb A1C,Glucose Est Avg 318 Final   Tacrolimus Trough (FK506)     Status: Abnormal   Result Value Status    Tacrolimus (FK506) <2.0 (L) Final   POC GLUCOSE Q1H     Status: Abnormal   Result Value Status    POC GLUCOSE 280 (H) Final   Basic Metabolic Panel     Status: Abnormal   Result Value Status    Sodium 138 Final    Potassium 3.8 Final    Chloride 100 Final    Carbon Dioxide Total 28 Final    Anion Gap 10 Final    Urea Nitrogen, Blood (BUN) 25 (H) Final    Creatinine Serum 1.00 Final  Glucose 188 (H) Final    Calcium 9.0 Final    E-GFR Creatinine (Male) 87 Final   Magnesium (Mg)     Status: Normal   Result Value Status    Magnesium (Mg) 2.1 Final   Phosphorus (PO4)     Status: Normal   Result Value Status    Phosphorus (PO4) 2.8 Final   POC GLUCOSE Q1H     Status: Abnormal   Result Value Status    POC GLUCOSE 145 (H) Final   POC GLUCOSE ONCE     Status: Abnormal   Result Value Status    POC GLUCOSE 124 (H) Final   POC GLUCOSE Q1H     Status: Abnormal   Result Value Status    POC GLUCOSE 104 (H) Final   POC GLUCOSE Q1H     Status: Abnormal   Result Value Status    POC GLUCOSE 113 (H) Final   POC GLUCOSE ONCE     Status: None   Result Value Status    POC GLUCOSE 89 Final   POC GLUCOSE PRN     Status: None   Result Value Status    POC GLUCOSE 72 Final   Basic Metabolic Panel     Status: Abnormal   Result Value Status    Sodium 140 Final    Potassium 3.4 Final    Chloride 104 Final    Carbon Dioxide Total 28 Final    Anion Gap 8 Final    Urea Nitrogen, Blood (BUN) 21 (H) Final     Creatinine Serum 0.92 Final    Glucose 80 Final    Calcium 8.8 Final    E-GFR Creatinine (Male) 96 Final   POC GLUCOSE PRN     Status: None   Result Value Status    POC GLUCOSE 81 Final   Hepatitis C Ab Screen     Status: Normal   Result Value Status    Hepatitis C Ab Screen Nonreactive Final   Thyroid Stimulating Hormone     Status: Normal   Result Value Status    Thyroid Stimulating Hormone 2.87 Final   Thyroxine, Free (Free T4)     Status: Normal   Result Value Status    Thyroxine, Free (Free T4) 1.37 Final   POC GLUCOSE PRN     Status: Abnormal   Result Value Status    POC GLUCOSE 252 (H) Final   POC GLUCOSE PRN     Status: Abnormal   Result Value Status    POC GLUCOSE 230 (H) Final   POC GLUCOSE PRN     Status: Abnormal   Result Value Status    POC GLUCOSE 258 (H) Final   Basic Metabolic Panel     Status: Abnormal   Result Value Status    Sodium 135 (L) Final    Potassium 3.9 Final    Chloride 102 Final    Carbon Dioxide Total 24 Final    Anion Gap 9 Final    Urea Nitrogen, Blood (BUN) 15 Final    Creatinine Serum 1.06 Final    Glucose 379 (H) Final    Calcium 7.6 (L) Final    E-GFR Creatinine (Male) 81 Final   CBC No Differential     Status: Abnormal   Result Value Status    White Blood Cell Count 7.4 Final    Red Blood Cell Count 4.73 Final    Hemoglobin 13.7 Final    Hematocrit 40.1 (L) Final  MCV 84.9 Final    MCH 28.9 Final    MCHC 34.0 Final    RDW 14.1 Final    MPV 8.9 Final    Platelet Count 174 Final   Magnesium (Mg)     Status: Normal   Result Value Status    Magnesium (Mg) 1.7 Final   POC GLUCOSE PRN     Status: Abnormal   Result Value Status    POC GLUCOSE 358 (H) Final   POC GLUCOSE PRN     Status: Abnormal   Result Value Status    POC GLUCOSE 399 (H) Final   POC GLUCOSE PRN     Status: None   Result Value Status    POC GLUCOSE 76 Final   POC GLUCOSE PRN     Status: Abnormal   Result Value Status    POC GLUCOSE 130 (H) Final         ASSESSMENT AND PLAN    57yr old male, with history of HTN, HLD,  DM2, Hypothyroidism, 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 Work up in ED showed HHS. Not DKA. 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   > Upon arrival to the ED, patients glucose level was >800mg /dl. Patient has been unable to self administer insulin due to vision changes.No evidence of DKA. S/p insulin gtt, lantus, IVF. Patient is currently off insulin gtt at time of admission with POC glucose ranging between 70-110mg /dl. 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    > hgb A1c 12.7  > kept on D10 overnight, w/ rebound hyperglycemia. Now Dced.   - IGT consulted    - - Give one time dose of Lantus 10 units SQ x1 now (done0   - Continue with Basal Insulin Glargine (Lantus) 15  units SQ qHS (holding now hypoglycemic)   - Continue with Nutritional Aspart (Novolog) 6 units TID AC (holding now hypoglycemic)   - Continue with Average Correctional Aspart (Novolog) Scale SQ, start Q 4 overnight given patient lability of blood sugar.   - Carb controlled diet  - hypoglycemia protocol   - today, after sliding scale and lantus, pt w/ blood sugar to 76. After dinner up to 130. Will monitor sugars q 4 hrs overnight and cover w/ sliding scale given pt overall lability of blood sugar.      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 He describes it as a red fog; he is able to see it through it but his vision is now much more limited. He also sees some spots. Denies flashes, dark curtain/shadow.   -Ophthalmology consulted-- -  Dilated exam showed diffusely hazy view  - B-scan ultrasound showed vitreous hemorrhage without retinal detachment   - Most likely etiology is diabetic retinopathy in the setting of uncontrolled diabetes  - Recommend minimal activity, sleep with head of bed elevated, avoid non medically necessary blood thinners and NSAIDs  - Return precautions discussed and emergency line reiterated  - Upon discharge, patient  should follow-up with the Retina Service in 1 week-referral placed   - Recommend lensometer reading of spectacles at that time to determine degree of myopia. Once vit heme has resolved should establish care with our retina service     AKI. Resolved.   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 Likely secondary to HHS. Unknown baseline. Current sCr 0.92. Of note, patient is s/p kidney transplant in 2011.   > tacrolimus level 3/23 < 2.0   -Monitor daily BMP  -glucose correction as noted as above   - cont home tacrolimus 1mg  BID  - con thome cellcept 750 BID  - follow-up tacrolimus level tomorrow   - tranplant nephro consulted.                 HLD  Continue atorvastatin      Hypothyroidism  > TSH within normal limits   Continue levothyroxine     HTN  Continue PTA lisinopril     FEN: DIABETIC DIET - MALE  SPECIAL TRAY REQUEST  SPECIAL TRAY REQUEST  SPECIAL TRAY REQUEST  SPECIAL TRAY REQUEST  DVT Prophylaxis: SCD in setting of vitreous hemorrhage.   Advanced Care Planning/Goals of Care: Full code   Disposition: pending PT/OT eval    Updated sister Joelene Millin in phone 12/22/22    Total time I spent in care of this patient today (excluding time spent on other billable services) was 55 minutes. This time does not overlap with other providers involved in this patient's care.       Report Electronically Signed by: Mayford Knife, [facility]

## 2022-12-22 NOTE — Nurse Focus (Signed)
Notified Sarah, NP that patients blood glucose = 358. Fluids to be discontinued.

## 2022-12-22 NOTE — Progress Notes (Addendum)
Inpatient Glycemic Team Progress Note  Consulting Service:  Endocrinology  HD: 0     Name:Kerry Mercado  MRN: [id number]  DOB: 04/14/1966    Note Started: 12/22/2022 10:10 Date of Service: 12/22/2022   Date of Admission: 12/20/2022 Current Attending: Kendell Bane*             Recommendations:  - Give one time dose of Lantus 10 units SQ x1 now  - Continue with Basal Insulin Glargine (Lantus) 15  units SQ qHS  - Continue with Nutritional Aspart (Novolog) 6 units TID AC  - Continue with Average Correctional Aspart (Novolog) Scale SQ TID AC  Assessment/Plan     57 year old male with Type 1 diabetes uncontrolled (HgbA1c pending, previous A1c per chart review of 11% in 11/2018) admitted for Right eye Vitreous Hemorrhage and severe hyperglycemia (initial glucose of 828 mg/dl; pH 7.36) approaching HHS with initial calculated serum osmolality of 306 mOsm/kg.   Glycemic management Is not within (100-180mg /dl) at this time.  Hyperglycemic this am ( 385mg /dl) as IVF with dextrose 10% was continued through the night (had thought that was [facility]) and Nutritional insulin 6 units with meals added But not given yesterday (12/21/22).  Patient adamant that his Lantus dose of 20 units SQ BID is right for him. Will give a one time of Lantus 10 units SQ x 1 this am (12/22/22).  Principal Problem:    Hyperosmolar hyperglycemic state (HHS)  Active Problems:    Status post kidney transplant    Failed pancreas transplant    Type 1 diabetes mellitus with ophthalmic complication    Vision loss of left eye    Type 1 diabetes mellitus with other ophthalmic complication    DM (diabetes mellitus), type 1, uncontrolled, with hyperosmolarity    Vitreous hemorrhage of right eye    Hyperkalemia  Currently the Patient's nutrition Rx is: 60-75gms Carbohydrates/meal (recommended for men)     Current Diet Order:   PO Diet    DIABETIC DIET - MALE   See above recommendations.    If NPO:  - decrease current basal insulin dose by 20%  - hold  scheduled Nutritional Novolog   - change current Correctional scale to q 4hours     Preliminary Discharge Recommendations:  Basaglar (Glargine) pen Or Covered Alternative  Aspart (Novolog) pen Or Covered Alternative  Inhaled Glucagon (Baqsimi) prn hypoglycemia  IM Adult Diabetes Discharge Order set [id number]) - will need a "talking glucometer" , lancets, test strips on DC  Establish and Follow up with for :  - further diabetes medication dose adjustments  - referral to Endocrinology ( interested in continuous glucose monitoring =CGM sensor and insulin pump) - was previously on an insulin pump and CGM (last time ~ 2012) prior to pancreas transplant    Summary:   57 year old male with Hx of Left eye vision loss, Type 1 diabetes uncontrolled (HgbA1c pending, previous A1c per chart review of 11% in 11/2018) admitted for Right eye Vitreous Hemorrhage and severe hyperglycemia (initial glucose of 828 mg/dl; pH 7.36) approaching HHS with initial calculated serum osmolality of 306 mOsm/kg.   Given Lantus 25 units ( 12/20/22 at ~ 2341) SQ prior to having insulin infusion started which was then DC (12/21/22 at ~ 0430) with symptomatic hypoglycemia (POC glucose 75mg /dl) in am (12/21/22 ~ 0745) even with IVF D5 / 0.45% NaCl running at 200cc/hr.    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 (0000000)  Diabetic complications: retinopathy and peripheral neuropathy   Followed by PCP  in [place] - last seen " a few months ago"  Last seen by Endo in [place] (patient cannot recall name) - Jan 2024  Interval History:   Glucose this am 358mg /dl    Recent labs for the past 24 hours      12/21/22  1304 12/21/22  1520 12/21/22  2043 12/22/22  0526 12/22/22  0722   PGLU 252* 230* 258*  --  358*   GLU  --   --   --  379*  --       Last 24 Hour Range: 230 - 358    Current Diet Order:   PO Diet    DIABETIC DIET - MALE          Current Inpatient Insulin Regimen:   Basal: Glargine (Lantus)  15  units SQ qHS  Nutritional: Aspart (Novolog)  6 units TID AC  Correctional: Average Correctional Aspart (Novolog) Scale SQ TID AC and 1-4 units Correctional Aspart SQ qHS prn  Total Daily Dose of Insulin (TDDI) in the last 24 hours: ~ 25 units    I have reviewed the Glycemic Management Summary and have noted the following blood glucose trends:       Subjective      " No,I've been on long acting insulin 20 units twice a day for years now - it has been working well for me"  I don't understand - I need my insulin with my meals - I am eating breakfast now - and I have not gotten my insulin yet"    R eye vision better - but cannot read numbers on glucometer  Has been relying on neighbor to come over to read the meter for him  Home DM Regimen: Diabetes Home Regimen:  On 12/21/22 reported taking:  Lantus 20 units SQ BID (last dose was on 12/20/22 in am prior to admission)  Meal time insulin of 15 units TID [facility]     Per chart review  Office Visit 10/25/19:   Basaglar 20 units SQ qHS  Humalog 15 units for Breakfast; 18 units for Lunch; 18 units for dinner;   12 units for HS snacks  Plus a scale with meals     DC Summary from [facility] (11/2018):-  Lantus 15 units SQ daily  Meal time Insulin:  Breakfast -15 units  Lunch - 18 units  Dinner - 18 units  And Snack coverage - 12 units  Correctional scale of 3 units for every 50mg /dl above 150mg /dl     -POC Glucose Checks: 3-4x a day; usual readings in the 200s range  -Hypoglycemia events: at least once a week  --Tx of hypoglycemia is with food; juice.  --Pt has needed emergency assistance for hypoglycemia in the past: denies   Inpatient Medications   Scheduled:      Atorvastatin (LIPITOR) Tablet 20 mg    Fluoxetine (PROZAC) Capsule/Tablet 40 mg    Insulin Aspart (NOVOLOG) Injection Pen 1-20 Units    Insulin Aspart (NOVOLOG) Injection Pen 1-4 Units    Insulin Aspart (NOVOLOG) Injection Pen 6 Units    Insulin Glargine (LANTUS)  100 unit/mL Injection 15 Units    LevoTHYROxine Tablet 75 mcg    Mycophenolate (MMF) (CELLCEPT) Capsule 750 mg    Pantoprazole (PROTONIX) Delayed Release Tablet 40 mg    Polyethylene Glycol 3350 (MIRALAX) Oral Powder Packet 17 g    Tacrolimus (FK-506) (PROGRAF) Capsule 1 mg    PRN:     Acetaminophen (TYLENOL) Tablet 650 mg    Bisacodyl (DULCOLAX) EC Tablet 10 mg    Camphor/Menthol (SARNA) Lotion    Capsaicin (ZOSTRIX) 0.025 % Cream    Dextrose 10% 10-20 g IVPB 100-200 mL    Dextrose 10% 10-20 g IVPB 100-200 mL    Diphenhydramine-Zinc Acetate (BENADRYL ITCH-RELIEF) 1-0.1 % Cream    Glucagon Injection 1 mg    Glucagon Injection 1 mg    Glucose Chewable Tablet 12-24 g    Melatonin Tablet 3 mg    Ondansetron (ZOFRAN) Injection 4 mg    Prochlorperazine (COMPAZINE) Injection 5-10 mg    Continuous:     [Held by provider] D5 / 0.45% NaCl Infusion       Allergies: Shellfish derived      Objective   Objective       Vitals:  Vital Signs have been reviewed   Most Recent:BP (!) 156/84   Pulse 80   Temp 36.7 C (98.1 F) (Oral)   Resp 14   Ht 1.829 m (6')   Wt 73.9 kg (163 lb)   SpO2 99%   BMI 22.11 kg/m     Results: The following results have been reviewed:     Labs - Past 72 Hours     12/22/22  0526   CREATININE BLOOD 1.06   GLUCOSE 379*   UREA NITROGEN, BLOOD (BUN) 15   SODIUM 135*   POTASSIUM 3.9   CHLORIDE 102   CARBON DIOXIDE TOTAL 24   CALCIUM 7.6*      No results found for: "HGBA1C"    Intake and Output Last Shift:  In: 831.3 [Oral:240; Crystalloid:591.3]  Out: 550 [Urine:550]    Physical Exam  Vitals and nursing note reviewed.   Constitutional:       Appearance: Normal appearance.   HENT:      Head: Normocephalic and atraumatic.   Pulmonary:      Effort:  Pulmonary effort is normal.   Musculoskeletal:         General: Normal range of motion.   Neurological:      Mental Status: He is alert and oriented to person, place, and time. Mental status is at baseline.               The above recommendations were given to the (A) [facility] service to optimize the patient's glycemic regimen.  Patient was seen and case discussed with Endocrinology attending Dr. Samule Dry and the above plan was formulated in part with them, who agreed with above recommendations.  Total time I spent in care of this patient today (excluding time spent on other billable services) was  41  minutes. This time does not overlap with other providers involved in this patient's care.     Alto Denver, NP       Inpatient Glycemic Team  [employer] ([phone])"Inpatient Glycemic Team'  (After hours- Endo Fellow On Call)

## 2022-12-23 ENCOUNTER — Other Ambulatory Visit: Payer: Self-pay

## 2022-12-23 DIAGNOSIS — E10319 Type 1 diabetes mellitus with unspecified diabetic retinopathy without macular edema: Secondary | ICD-10-CM

## 2022-12-23 DIAGNOSIS — E1042 Type 1 diabetes mellitus with diabetic polyneuropathy: Secondary | ICD-10-CM

## 2022-12-23 DIAGNOSIS — H5462 Unqualified visual loss, left eye, normal vision right eye: Secondary | ICD-10-CM

## 2022-12-23 DIAGNOSIS — E1021 Type 1 diabetes mellitus with diabetic nephropathy: Secondary | ICD-10-CM

## 2022-12-23 DIAGNOSIS — Z9483 Pancreas transplant status: Secondary | ICD-10-CM

## 2022-12-23 DIAGNOSIS — E1039 Type 1 diabetes mellitus with other diabetic ophthalmic complication: Secondary | ICD-10-CM

## 2022-12-23 LAB — CBC NO DIFFERENTIAL
Hematocrit: 42.6 % (ref 41.0–53.0)
Hemoglobin: 14.4 g/dL (ref 13.5–17.5)
MCH: 28.8 pg (ref 27.0–33.0)
MCHC: 33.8 % (ref 32.0–36.0)
MCV: 85 fL (ref 80.0–100.0)
MPV: 8.3 fL (ref 6.8–10.0)
Platelet Count: 175 10*3/uL (ref 130–400)
RDW: 14.1 % (ref 0.0–14.7)
Red Blood Cell Count: 5.01 10*6/uL (ref 4.50–5.90)
White Blood Cell Count: 6.2 10*3/uL (ref 4.5–11.0)

## 2022-12-23 LAB — POC GLUCOSE
POC GLUCOSE: 297 mg/dL — ABNORMAL HIGH (ref 70–99)
POC GLUCOSE: 362 mg/dL — ABNORMAL HIGH (ref 70–99)
POC GLUCOSE: 348 mg/dL — ABNORMAL HIGH (ref 70–99)
POC GLUCOSE: 287 mg/dL — ABNORMAL HIGH (ref 70–99)
POC GLUCOSE: 517 mg/dL (ref 70–99)
POC GLUCOSE: 259 mg/dL — ABNORMAL HIGH (ref 70–99)
POC GLUCOSE: 207 mg/dL — ABNORMAL HIGH (ref 70–99)
POC GLUCOSE: 282 mg/dL — ABNORMAL HIGH (ref 70–99)

## 2022-12-23 LAB — CULTURE SURVEILLANCE, MRSA

## 2022-12-23 LAB — MAGNESIUM (MG): Magnesium (Mg): 1.7 mg/dL (ref 1.6–2.4)

## 2022-12-23 LAB — TACROLIMUS TROUGH (FK506): Tacrolimus (FK506): 4.3 ng/mL — ABNORMAL LOW (ref 5.0–15.0)

## 2022-12-23 MED ORDER — INSULIN GLARGINE 100 UNIT/ML SUB-Q VIAL
5.0000 [IU] | Freq: Once | SUBCUTANEOUS | Status: AC
Start: 2022-12-23 — End: 2022-12-23
  Administered 2022-12-23: 5 [IU] via SUBCUTANEOUS
  Filled 2022-12-23: qty 1000

## 2022-12-23 MED ORDER — IBUPROFEN 400 MG TABLET
400.0000 mg | ORAL_TABLET | Freq: Four times a day (QID) | ORAL | Status: DC | PRN
Start: 2022-12-23 — End: 2022-12-23
  Administered 2022-12-23: 400 mg via ORAL
  Filled 2022-12-23: qty 1

## 2022-12-23 MED ORDER — INSULIN ASPART (U-100) 100 UNIT/ML (3 ML) SUBCUTANEOUS PEN
1.0000 [IU] | PEN_INJECTOR | Freq: Three times a day (TID) | SUBCUTANEOUS | Status: DC
Start: 2022-12-23 — End: 2022-12-24
  Administered 2022-12-23: 3 [IU] via SUBCUTANEOUS
  Administered 2022-12-23: 4 [IU] via SUBCUTANEOUS
  Administered 2022-12-23: 2 [IU] via SUBCUTANEOUS
  Administered 2022-12-24: 12 [IU] via SUBCUTANEOUS
  Administered 2022-12-24: 5 [IU] via SUBCUTANEOUS
  Administered 2022-12-24: 7 [IU] via SUBCUTANEOUS

## 2022-12-23 MED ORDER — INSULIN ASPART (U-100) 100 UNIT/ML (3 ML) SUBCUTANEOUS PEN
6.0000 [IU] | PEN_INJECTOR | Freq: Three times a day (TID) | SUBCUTANEOUS | Status: DC
Start: 2022-12-23 — End: 2022-12-23

## 2022-12-23 MED ORDER — SIMETHICONE 80 MG CHEWABLE TABLET
80.0000 mg | CHEWABLE_TABLET | Freq: Four times a day (QID) | ORAL | Status: DC | PRN
Start: 2022-12-23 — End: 2022-12-27
  Administered 2022-12-23 – 2022-12-26 (×3): 80 mg via ORAL
  Filled 2022-12-23 (×3): qty 1

## 2022-12-23 MED ORDER — INSULIN ASPART (U-100) 100 UNIT/ML (3 ML) SUBCUTANEOUS PEN
1.0000 [IU] | PEN_INJECTOR | Freq: Three times a day (TID) | SUBCUTANEOUS | Status: DC
Start: 2022-12-23 — End: 2022-12-23

## 2022-12-23 MED ORDER — INSULIN GLARGINE 100 UNIT/ML SUB-Q VIAL
15.0000 [IU] | SUBCUTANEOUS | Status: DC
Start: 2022-12-23 — End: 2022-12-24
  Administered 2022-12-23 – 2022-12-24 (×2): 15 [IU] via SUBCUTANEOUS

## 2022-12-23 MED ORDER — INSULIN ASPART (U-100) 100 UNIT/ML (3 ML) SUBCUTANEOUS PEN
1.0000 [IU] | PEN_INJECTOR | Freq: Three times a day (TID) | SUBCUTANEOUS | Status: DC
Start: 2022-12-23 — End: 2022-12-24
  Administered 2022-12-23: 5 [IU] via SUBCUTANEOUS
  Filled 2022-12-23: qty 300

## 2022-12-23 MED ORDER — INSULIN ASPART (U-100) 100 UNIT/ML (3 ML) SUBCUTANEOUS PEN
6.0000 [IU] | PEN_INJECTOR | Freq: Three times a day (TID) | SUBCUTANEOUS | Status: DC
Start: 2022-12-23 — End: 2022-12-23
  Administered 2022-12-23 (×2): 6 [IU] via SUBCUTANEOUS

## 2022-12-23 MED ORDER — INSULIN ASPART (U-100) 100 UNIT/ML (3 ML) SUBCUTANEOUS PEN
1.0000 [IU] | PEN_INJECTOR | Freq: Three times a day (TID) | SUBCUTANEOUS | Status: DC
Start: 2022-12-23 — End: 2022-12-24
  Administered 2022-12-23: 7 [IU] via SUBCUTANEOUS
  Administered 2022-12-24: 2 [IU] via SUBCUTANEOUS
  Administered 2022-12-24: 7 [IU] via SUBCUTANEOUS
  Filled 2022-12-23: qty 300

## 2022-12-23 NOTE — Progress Notes (Addendum)
Inpatient Glycemic Team Progress Note  Consulting Service:  Endocrinology  HD: 1     Name:Kerry Mercado  MRN: [id number]  DOB: 12-01-1965    Note Started: 12/23/2022 06:33 Date of Service: 12/23/2022   Date of Admission: 12/20/2022 Current Attending: Kendell Bane*        Addendum 14:03  - Give one time dose of Basal Insulin Glargine (Lantus) 5 units SQ QHS   - Tomorrow, Increase Basal Insulin Glargine (Lantus) 20 units SQ Qam  - Continue Nutritional Aspart (Novolog) Insulin to Carbohydrate Ratio 1:10 TID AC   - Continue Snack Nutritional Aspart (Novolog) Insulin to Carbohydrate Ratio 1:10 TID in between meals  - Continue Sensitive Correctional Aspart (Novolog) Scale SQ TID AC    Addendum 13:00 POC BG 287mg /dL; patient would benefit from changing nutritional insulin to an insulin to carbohydrate ratio and adding snack coverage  - Continue Basal Insulin Glargine (Lantus) 15 units SQ qAM  - Change to Nutritional Aspart (Novolog) Insulin to Carbohydrate Ratio 1:10 TID AC   - Start Snack Nutritional Aspart (Novolog) Insulin to Carbohydrate Ratio 1:10 TID in between meals  - Continue Sensitive Correctional Aspart (Novolog) Scale SQ TID AC    Recommendation:  - Unhold and Retime Basal Insulin Glargine (Lantus)  15 units SQ qAM *give now*  - Unhold Nutritional Aspart (Novolog)  6 units TID AC  - Change to Sensitive Correctional Aspart (Novolog) Scale SQ TID AC  - Discontinue Bedtime 1-4 units QHS        Assessment/Plan     The patient's basal and nutritional insulins were held last night, currently correctional insulin Q4H though diet is ordered. AM POC bG 362mg /dL. Recommend giving basal insulin *now* since patients with Type 1 Diabetes must have basal insulin every 24 hours, resuming nutritional and changing correctional to TID.     Glycemic management is not within (100-180mg /dl) at this time.    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    Insulin CDS - Subcutaneous Dosing: Please refer to CDS tool for dosing used for physiologic insulin regimen:Insulin CDS - Subcutaneous Dosing    If there are any plans for patient to go to surgery in am,or NPO after midnight:  - decrease current basal insulin dose by 20% the evening prior to becoming NPO  - hold scheduled Nutritional Novolog (as NPO)  - change current Correctional scale to q 4 hours while NPO  - monitor POC glucose to q4 hours while NPO    Preliminary Discharge Recommendations:  TBD*  - Tresiba pen ($11 /mo) 15 units QHS  - Aspart (Novolog) pen Or Covered Alternative   - Inhaled Glucagon (Baqsimi) PRN hypoglycemia  - IM Adult Diabetes Discharge Order set [phone]) - will need a "talking glucometer" , lancets, test strips on DC    Establish and Follow up with PCP for :  - further diabetes medication dose adjustments  -  referral to Endocrinology ( interested in continuous glucose monitoring = CGM sensor and insulin pump) - was previously on an insulin pump and CGM (last time ~ 2012) prior to pancreas transplant     Summary:   57 year old male with Type 1 diabetes uncontrolled (HgbA1c 12.7% on Q000111Q) complicated by retinopathy and peripheral neuropathy admitted for Right eye Vitreous Hemorrhage and severe hyperglycemia (initial glucose of 828mg /dl) with Past Medical History significant for 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 Inpatient Glycemic Team was consulted for glycemic management.     Diabetes Type 1 x 33 years. 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 (0000000)  Diabetic complications: retinopathy and peripheral neuropathy   Followed by PCP  in [place] - last seen " a few months ago"  Last seen by Endo in [place] (patient cannot recall name) - Jan 2024    Interval History:   - 12/23/22: AM POC BG 362mg /dL    Recent labs for the past 24 hours     12/22/22  0722 12/22/22  1149 12/22/22  1629 12/22/22  1741 12/22/22  1956 12/23/22  0108 12/23/22  0520   PGLU 358* 399* 76 130* 207* 297* 362*      Last 24 Hour Range: 76 - 399    Current Diet Order:   PO Diet    DIABETIC DIET - MALE          Current Inpatient Insulin Regimen:   Basal: Glargine (Lantus)  15  units SQ qHS (held)  Nutritional: Aspart (Novolog)  6 units TID AC (held)  Correctional: Sensitive Correctional Aspart (Novolog) Scale SQ q4hours  Bedtime QHS (held)  Total Daily Dose of Insulin (TDDI) in the last 24 hours: ~ 50 units    I have reviewed the Glycemic Management Summary and have noted the following blood glucose trends:     Subjective   Subjective      Patient is concerned he is not getting enough insulin coverage. Is amenable to change in plan. Understands phone is not compatible with [employer] CGM apps.     Denies any chest pain, dyspnea, N/V, abdominal pain  No reports of polyuria, polydipsia, polyphagia.    Home DM Regimen: Diabetes Home Regimen:  On 12/21/22 reported taking:  - Lantus 20 units SQ BID (last dose was on 12/20/22 in am prior to admission)  - Meal time insulin of 15 units TID [facility]     Per chart review  Office Visit 10/25/19:   - Basaglar 20 units SQ qHS  - Humalog 15 units for Breakfast; 18 units for Lunch; 18 units for dinner;   - 12 units for HS snacks  - Plus a scale with meals     DC Summary from  [facility] (11/2018):-  - Lantus 15 units SQ daily  - Meal time Insulin:  Breakfast -15 units  Lunch - 18 units  Dinner - 18 units  - And Snack coverage - 12 units  - Correctional scale of 3 units for every 50mg /dl above 150mg /dl     -POC Glucose Checks: 3-4x a day; usual readings in the 200s range  -Hypoglycemia events: at least once a week  --Tx of hypoglycemia is with food; juice.  --Pt has needed emergency  assistance for hypoglycemia in the past: denies     Inpatient Medications   Scheduled:     Atorvastatin (LIPITOR) Tablet 10 mg    Fluoxetine (PROZAC) Capsule/Tablet 40 mg    Insulin Aspart (NOVOLOG) Injection Pen 1-20 Units    [Held by provider] Insulin Aspart (NOVOLOG) Injection Pen 1-4 Units    [Held by provider] Insulin Aspart (NOVOLOG) Injection Pen 6 Units    [Held by provider] Insulin Glargine (LANTUS) 100 unit/mL Injection 15 Units    LevoTHYROxine Tablet 75 mcg    Mycophenolate (MMF) (CELLCEPT) Capsule 750 mg    Pantoprazole (PROTONIX) Delayed Release Tablet 40 mg    Polyethylene Glycol 3350 (MIRALAX) Oral Powder Packet 17 g    Tacrolimus (FK-506) (PROGRAF) Capsule 1 mg    PRN:     Acetaminophen (TYLENOL) Tablet 650 mg    Bisacodyl (DULCOLAX) EC Tablet 10 mg    Camphor/Menthol (SARNA) Lotion    Capsaicin (ZOSTRIX) 0.025 % Cream    Dextrose 10% 10-20 g IVPB 100-200 mL    Diphenhydramine-Zinc Acetate (BENADRYL ITCH-RELIEF) 1-0.1 % Cream    Glucagon Injection 1 mg    Glucose Chewable Tablet 12-24 g    Ibuprofen (MOTRIN) Tablet 400 mg    Melatonin Tablet 3 mg    Ondansetron (ZOFRAN) Injection 4 mg    Prochlorperazine (COMPAZINE) Injection 5-10 mg    Continuous:     [Held by provider] D5 / 0.45% NaCl Infusion     Allergies: Shellfish derived      Objective   Objective       Vitals:  Vital Signs have been reviewed   Most Recent:BP (!) 160/88   Pulse 85   Temp 36.7 C (98.1 F) (Oral)   Resp 16   Ht 1.829 m (6')   Wt 73.9 kg (163 lb)   SpO2 100%   BMI 22.11 kg/m     Results: The  following results have been reviewed:Labs     Labs - Past 72 Hours     12/21/22  0140 12/21/22  0302 12/22/22  0526   HGB A1C 12.7*  --   --    CREATININE BLOOD  --    < > 1.06   GLUCOSE  --    < > 379*   UREA NITROGEN, BLOOD (BUN)  --    < > 15   SODIUM  --    < > 135*   POTASSIUM  --    < > 3.9   CHLORIDE  --    < > 102   CARBON DIOXIDE TOTAL  --    < > 24   CALCIUM  --    < > 7.6*    < > = values in this interval not displayed.        Hgb A1C (%)   Date Value   12/21/2022 12.7     Intake and Output Last Shift:  In: 1080 [Oral:1080]  Out: -     Physical Exam  Vitals and nursing note reviewed.   Constitutional:       General: He is awake.      Appearance: He is normal weight.   HENT:      Head: Normocephalic.      Nose: Nose normal.      Mouth/Throat:      Mouth: Mucous membranes are moist.      Pharynx: Oropharynx is clear.   Eyes:      Conjunctiva/sclera: Conjunctivae normal.   Cardiovascular:  Rate and Rhythm: Normal rate.   Pulmonary:      Effort: Pulmonary effort is normal.   Abdominal:      Palpations: Abdomen is soft.   Skin:     General: Skin is warm and dry.   Neurological:      Mental Status: He is alert and oriented to person, place, and time.             The above recommendations were given to Dr. Colbert Coyer, Malva Cogan, MD   of the ([facility] service to optimize the patient's glycemic regimen.    Patient was seen and case discussed with Endocrinology attending Dr. Elroy Channel and the above plan was formulated in part with them, who agreed with above recommendations.    Total time I spent in care of this patient today (excluding time spent on other billable services) was >35 minutes. This time does not overlap with other providers involved in this patient's care.     Anson Crofts, NP       Inpatient Glycemic Team  [employer] [phone])  (After hours- Endo Fellow On Call)  I have personally performed a face to face diagnostic evaluation on this  patient.  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 Inpatient Glycemic Team was consulted for glycemic management.    The patient has longstanding type 1 diabetes with microvascular complications including retinopathy, peripheral neuropathy and nephropathy status post deceased donor kidney and pancreas transplant.  On admission the patient had severe hyperglycemia but a normal anion gap and calculated osmolality of 306.  He was on an insulin infusion but for a short period of time with a rather quick resolution to hyperglycemia and much higher insulin doses reported he is using at home with an A1c that suggests poor control.  The patient has had fluctuating blood glucose levels initially starting with Lantus 25 units and then decreasing to 15 and then to 10 with much higher nutritional and correctional dosing.  Given all of this and the fact that he was possibly n.p.o. this morning a weight-based calculation was used to restart insulin dosing and using and ICR rather than a scheduled dose.      The patient is able to see very large print, but visual acuity is an issue.  He is able to count clicks to administer insulin by pen.  He was frustrated at the time of my visit feeling he had not been given enough nutritional insulin.      General Appearance: No distress. Well  developed individual    HEENT: Normocephalic. Moist mucous membranes. Tongue midline.  Heart: Normal rate and regular rhythm. No murmurs, clicks, or gallops.   Lungs: Clear to auscultation. Respirations unlabored.  Extremities: No edema, No erythema or tenderness.     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  The NP was unable to get Elenor Legato to app to work on his phone and thus he will need a receiver.  The patient's orders were changed to ICR in afternoon and more aggressive after he continued with hyperglycemia.    He would benefit from Endocrine and Diabetes education follow- up to review CHO counting, diabetes technology including AID technology which may make it easier to improve glucose control.     Total time I spent in care of this patient today (excluding time spent on other billable services) was >35 minutes. This time does not overlap with other providers involved in this patient's care.     Jolene Provost, MD  Associate Clinical Professor of Medicine  Director, Inpatient Glycemic Team  Division of Endocrinology, Diabetes and Metabolism  [employer]

## 2022-12-23 NOTE — Care Plan (Signed)
Problem: Adult Inpatient Plan of Care  Goal: Plan of Care Review  Flowsheets (Taken 12/23/2022 0453)  Plan of Care Reviewed With: patient  Outcome Evaluation: Pt A&Ox4, Legally blind in left eye and blurry vision in right eye. Blood glucose monitored q4h, with insulin coverage given per order. Plan of care ongoing. No acute events. Call light in reach.

## 2022-12-23 NOTE — Nurse Assessment (Signed)
ASSESSMENT NOTE    Note Started: 12/23/2022, 07:32     Initial assessment completed and recorded in EMR.  Report received from night shift nurse and orders reviewed. Plan of Care reviewed and appropriate, discussed with patient.  Amparo Bristol, RN

## 2022-12-23 NOTE — Progress Notes (Signed)
INTERNAL MEDICINE DAILY PROGRESS NOTE    Date: 12/23/2022 Time: 13:29    CHIEF COMPLAINT  Blurry vision     INTERVAL  -presented to ED w/ blury vision and inability to take insulin   - found to be in HHS.   - labile blood sugars; IGT consulted     SUBJECTIVE  - understands sugars are still labile  - has chronic back pain, worse sleeping on hospital bed  - states he is agreeable to DC w/ sister.   - states he is on prednisone at home.   - called back in the afternoon as per nursing, pt raising voice, cursing, upset about his sugars still being high. Had long discussion w/ pt regarding his lability of sugars and that we are trying to prevent lows, and daily increase his regimen until we get to a good point, so he has a good plan to go home on. Pt very insistant needs sugars checked every hr to prevent highs and lows. I discussed w/ pt that this is not feasable in the hospital, unless on insulin gtt. Pt fears he is in DKA. Will draw labs to eval for DKA. However, given patients overal blood sugar lability, sugars consistently in the 400s today despite insulin management and patient's overall stress which seems to be impacting care, I feel it is reasonable to start low insulin drip now, see how his sugar responds overnight and determine how much insulin he may need. I relayed this plan as well to his sister.     OBJECTIVE    Vitals Signs  Current Vitals  Temp: 36.6 C (97.9 F)  BP: 116/79  Pulse: 90  Resp: 18  SpO2: 99 %     Weight: 73.9 kg (163 lb)    Intake and Output  Last Two Completed Shifts  In: 840 [Oral:840]  Out: -     Current Shift  No intake/output data recorded.    PHYSICAL EXAM     Physical Exam  HENT:      Head: Normocephalic and atraumatic.   Eyes:      General: No scleral icterus.        Right eye: No discharge.         Left eye: No discharge.      Extraocular Movements: Extraocular movements intact.      Conjunctiva/sclera: Conjunctivae normal.   Cardiovascular:      Rate and Rhythm: Normal rate  and regular rhythm.      Heart sounds: No murmur heard.     No friction rub. No gallop.   Pulmonary:      Effort: Pulmonary effort is normal. No respiratory distress.      Breath sounds: No stridor. No wheezing, rhonchi or rales.   Abdominal:      General: There is no distension.      Palpations: Abdomen is soft.      Tenderness: There is no abdominal tenderness. There is no guarding.   Musculoskeletal:      Right lower leg: No edema.      Left lower leg: No edema.   Skin:     General: Skin is warm and dry.      Coloration: Skin is not jaundiced.      Comments: No rash to exposed skin   Neurological:      Mental Status: He is alert.      Comments: Moving all extremities spontaneously. Alert and oriented to self and situation  Labs and Studies  I have reviewed the following information from the last 24 hours: allied health and treating physician notes, imaging, and labs and microbiology data          Lab Results - 48 hours (excluding micro and POC)   POC GLUCOSE PRN     Status: Abnormal   Result Value Status    POC GLUCOSE 230 (H) Final   POC GLUCOSE PRN     Status: Abnormal   Result Value Status    POC GLUCOSE 258 (H) Final   Culture Surveillance, MRSA     Status: None    Specimen: OTHER (SPECIFY IN ADDNL INFO); NASAL SWAB   Result Value Status    CULTURE SURVEILLANCE, MRSA No MRSA isolated Final   POC GLUCOSE Q1H     Status: Abnormal   Result Value Status    POC GLUCOSE 319 (H) Final   Basic Metabolic Panel     Status: Abnormal   Result Value Status    Sodium 135 (L) Final    Potassium 3.9 Final    Chloride 102 Final    Carbon Dioxide Total 24 Final    Anion Gap 9 Final    Urea Nitrogen, Blood (BUN) 15 Final    Creatinine Serum 1.06 Final    Glucose 379 (H) Final    Calcium 7.6 (L) Final    E-GFR Creatinine (Male) 81 Final   CBC No Differential     Status: Abnormal   Result Value Status    White Blood Cell Count 7.4 Final    Red Blood Cell Count 4.73 Final    Hemoglobin 13.7 Final    Hematocrit 40.1 (L)  Final    MCV 84.9 Final    MCH 28.9 Final    MCHC 34.0 Final    RDW 14.1 Final    MPV 8.9 Final    Platelet Count 174 Final   Magnesium (Mg)     Status: Normal   Result Value Status    Magnesium (Mg) 1.7 Final   POC GLUCOSE PRN     Status: Abnormal   Result Value Status    POC GLUCOSE 358 (H) Final   POC GLUCOSE PRN     Status: Abnormal   Result Value Status    POC GLUCOSE 399 (H) Final   POC GLUCOSE PRN     Status: None   Result Value Status    POC GLUCOSE 76 Final   POC GLUCOSE PRN     Status: Abnormal   Result Value Status    POC GLUCOSE 130 (H) Final   POC GLUCOSE PRN     Status: Abnormal   Result Value Status    POC GLUCOSE 207 (H) Final   POC GLUCOSE Q4H     Status: Abnormal   Result Value Status    POC GLUCOSE 297 (H) Final   CBC No Differential     Status: Normal   Result Value Status    White Blood Cell Count 6.2 Final    Red Blood Cell Count 5.01 Final    Hemoglobin 14.4 Final    Hematocrit 42.6 Final    MCV 85.0 Final    MCH 28.8 Final    MCHC 33.8 Final    RDW 14.1 Final    MPV 8.3 Final    Platelet Count 175 Final   Magnesium (Mg)     Status: Normal   Result Value Status    Magnesium (Mg) 1.7 Final   Tacrolimus  Trough (FK506)     Status: Abnormal   Result Value Status    Tacrolimus (FK506) 4.3 (L) Final   POC GLUCOSE PRN     Status: Abnormal   Result Value Status    POC GLUCOSE 362 (H) Final   POC GLUCOSE PRN     Status: Abnormal   Result Value Status    POC GLUCOSE 348 (H) Final   POC GLUCOSE PRN     Status: Abnormal   Result Value Status    POC GLUCOSE 287 (H) Final     IMPRESSION:  1. Transplant renal artery stenosis screening risk category: Low. See  management recommendations below.    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 Work up in ED showed HHS. Not DKA. He was seen by Ophthalmology and confirmed patient has vitreous  hemorrhage to R eye      Hyperglycemia/HHS  Type 1 DM, poorly controlled w/ complications of nephropathy and retinopathy.   > Upon arrival to the ED, patients glucose level was >800mg /dl. Patient has been unable to self administer insulin due to vision changes.No evidence of DKA. S/p insulin gtt, lantus, IVF. Patient is currently off insulin gtt at time of admission with POC glucose ranging between 70-110mg /dl. 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    > hgb A1c 12.7  > kept on D10 overnight, w/ rebound hyperglycemia. Now Dced.   - IGT consulted   - for now on insulin drip. See subjective above.   - Carb controlled diet  - hypoglycemia protocol   - working on getting talking glucometer.      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 He describes it as a red fog; he is able to see it through it but his vision is now much more limited. He also sees some spots. Denies flashes, dark curtain/shadow.   -Ophthalmology consulted-- - Dilated exam showed diffusely hazy view  - B-scan ultrasound showed vitreous hemorrhage without retinal detachment   - Most likely etiology is diabetic retinopathy in the setting of uncontrolled diabetes  - Recommend minimal activity, sleep with head of bed elevated, avoid non medically necessary blood thinners and NSAIDs  - Return precautions discussed and emergency line reiterated  - Upon discharge, patient should follow-up with the Retina Service in 1 week-referral placed   - Recommend lensometer reading of spectacles at that time to determine degree of myopia. Once vit heme has resolved should establish care with our retina service     AKI. Resolved.   ESRD 2/2 t1DM nephropathy w/ living kidney transplant  (2010)  History of pancreatic transplant s/p rejection   sCr upon arrival to ED was 1.22. Likely secondary to HHS. Unknown baseline. Current sCr 0.92. Of note, patient is s/p kidney transplant in 2011.   > tacrolimus level 3/23 < 2.0. now 4.3. suspect possible non compliance.   > transplant kidney US w/ low risk of RAS  -Monitor daily BMP  -glucose correction as noted as above   - increase tacrolimus to 1mg  am,  2mg  pm, per transplant nephro recs.   - con thome cellcept 750 BID  - follow-up tacrolimus level tomorrow   - tranplant nephro consulted. - cont tacro as above.   - will discuss prednisone w/ transplant team as pt states he is on that.                 HLD  Continue atorvastatin      Hypothyroidism  > TSH within normal limits   Continue levothyroxine     HTN  Continue PTA lisinopril     FEN: DIABETIC DIET - MALE  SPECIAL TRAY REQUEST  SPECIAL TRAY REQUEST  SPECIAL TRAY REQUEST  SPECIAL TRAY REQUEST  SPECIAL TRAY REQUEST  DVT Prophylaxis: SCD in setting of vitreous hemorrhage.   Advanced Care Planning/Goals of Care: Full code   Disposition: pending PT/OT eval    Updated sister Joelene Millin in phone 12/24/22    Total time I spent in care of this patient today (excluding time spent on other billable services) was 75 minutes. This time does not overlap with other providers involved in this patient's care.       Report Electronically Signed by: Mayford Knife, [facility]

## 2022-12-23 NOTE — Social Work (Signed)
CLINICAL SOCIAL SERVICES PSYCHOSOCIAL ASSESSMENT    Note Date and Time: 12/23/2022    14:27  Date of Admission: 12/20/2022  6:55 PM    Date of Service: 12/23/2022 Referral Source: medical team     Patient Name: Kerry Mercado Race:  Other    DOB: [birth year] Age: 86yr   Length of Stay: 1    Coverage Information: Payor: MEDICARE / Plan: MEDICARE PART A&B / Product Type: *No Product type* /  PCP: Soe, Than     Is an interpreter used?: No  Person(s) interviewed: Patient  Reason for Consult: community resources    EMERGENCY CONTACT   Emergency Contact #1 Name: Vaughan Basta  Relationship to patient: Sister  Phone #: [phone]     ADVANCED CARE PLANNING  Advance Care Planning Reviewed: no docs on file  In the event patient is unable to make medical decisions, patient has verbally designated the surrogate decision maker as: Charisse Klinefelter    REASON FOR MEDICAL TREATMENT  Per EMR Chart, Hyperosmolar hyperglycemic state (HHS) [E11.00]  DM (diabetes mellitus), type 1, uncontrolled, with hyperosmolarity [E10.69, E10.65, E87.0]  Vitreous hemorrhage of right eye [H43.11]  Hyperkalemia [E87.5]  Vision loss of left eye [H54.62]  Type 1 diabetes mellitus with other ophthalmic complication 0000000     PSYCHOSOCIAL HISTORY  Sources of Support: sibling(s)  Marital status: Divorced  Patient resides with: alone  Current Living Arrangements: home/apartment/condo  Primary Care Provided by: in-home support service provider  Equipment Currently Used at Home: none  Baseline functional status: Requires assistance with some ADLs  Transportation: None  Able to Return to Prior Arrangements: yes  Current or Previous Armed forces logistics/support/administrative officer: none  Source of Income: social security  Total Income Amount Per Month: $1900.00/month  Who Manages Finances?: family member (Sister assist pt in manage his fiances)  Patient Legal Involvement: none  Substance Use: Active Use  Substance Use: marijuana, alcohol  History of Domestic Violence for patient?:  Denies    [place]  Patient Behavioral Health History: depression, anxiety  Current Self-injurious Behavior: denies  Past Self-injurious Behavior: denies  Patient Current Behavioral Health Treatment: none    COPING/STRESS  Recent Stressors: medical condition/diagnosis  Patient Personal Strengths: able to adapt, resourceful, strong support system  Sources of Support: sibling(s)  Reaction to Health Status: adjusting    MENTAL STATUS EXAM  Alert: Yes  Oriented: X4  Appearance: Appropriate   Long Term Memory: WNL  Short Term Memory: WNL  Behavior: Cooperative, Guarded  Attitude: Engaging  Affect: Appropriate/Full  Mood: Euthymic  Speech: Clear  Ability to Communicate: Good  Concentration: Good  Comprehension: Good  Insight: Good  Judgement: Good    CAREGIVER AVAILABILITY/SUPPORT  Family Caregiver if Needed: none     SOCIAL INFLUENCERS OF HEALTH ([place])  In the last 12 months, was there a time when you were not able to pay the mortgage or rent on time?: Yes  In the last 12 months, how many places have you lived?: 1  In the last 12 months, was there a time when you did not have a steady place to sleep or slept in a shelter (including now)?: No    Within the past 12 months, you worried that your food would run out before you got the money to buy more.: Never true  Within the past 12 months, the food you bought just didn't last and you didn't have money to get more.: Never true  In the past four weeks, were you or any household  member not able to eat the kinds of foods that you preferred because of lack of resources?: No  How hard is it for you to pay for the very basics like food, housing, medical care, and heating?: Not very hard  In the past 12 months has the electric, gas, oil, or water company threatened to shut off services in your home?: No  In the past 12 months, has lack of transportation kept you from medical appointments or from getting medications?: Yes  In the past 12 months, has lack of transportation  kept you from meetings, work, or from getting things needed for daily living?: Yes    ASSESSMENT   LCSW received referral and reviewed medical record for pertinent information. LCSW met with patient at bedside. LCSW informed patient of reason for visit.     Patient states that he lives alone at [address], [place]. Pt states that his sister assist him with his fiances as he is not good managing. Pt shared that once he go divorced lots of aspects of his life became overwhelming. Pt states that he is doing ok at home prior to his vision lost. Pt is hoping his vision return to prior functional status.     LCSW discussed with pt regarding In [place] (IHSS). Pt states that he is interested in applying for additional support. LCSW assisted pt in completing IHSS application.     Pt states that he does not have transportation for medical appointment. LCSW explained to patient regarding Medi-cal transportation benefits and provided information for transportation.          INTERVENTION  Consulted with medical team  Intervention(s): Consulted with medical team, Met/spoke with patient/family to complete psychosocial assessment, Reviewed pertinent portions of pt's chart/EMR, LCSW provided active listening, validation, empathy, and support  Counseling provided: Adaptation to illness and lifestyle changes, Managing depression/anxiety/mental health/stress  Resources/Referrals: Referral(s)  Provided with Resource Information for: Transportation  Referral: IHSS    RECOMMENDATIONS  None     MANDATED REPORTING  Mandated Reporting: No    PLAN  Social Services will continue to be available for psychosocial support and provide resources throughout hospitalization    Ferol Luz, LCSW

## 2022-12-23 NOTE — Nurse Assessment (Signed)
ASSESSMENT NOTE    Note Started: 12/23/2022, 23:35     Initial assessment completed and recorded in EMR.  Report received from day shift nurse and orders reviewed. Plan of Care reviewed and appropriate, discussed with patient.  Arvil Persons, RN RN

## 2022-12-23 NOTE — Progress Notes (Signed)
PHARMACIST THERAPEUTIC DRUG MONITORING    Patient Name: Kerry Mercado  MRN: [id number]    Medication: tacrolimus    Indication: LDRT 2011     Goal level: 4-6 ng/mL (pending clarification per Transplant nephrology)    Most recent level:   Recent labs for the past 144 hours     12/21/22  0140 12/23/22  0518   TACROLIMUS <2.0* 4.3*     Dose associated with level: 1 mg every 12 hours (3/24 @ 2109)     Pertinent Drug-Drug Interactions: NA    Assessment and Plan: Most recent level is likely subtherapeutic, drawn 4 hours early . Recommend to continue the current dosing regimen of 1 mg every 12 hours. Defer to transplant nephrology to determine appropriate goal trough level.    Pharmacist will continue to follow along for any additional dose adjustments as needed.      Signed By:   Royden Purl, PharmD, BCPS  Clinical Pharmacist, Internal Medicine  [facility] [phone]) or [place]    Department of Pharmacy Services

## 2022-12-23 NOTE — Care Plan (Signed)
Problem: Adult Inpatient Plan of Care  Goal: Plan of Care Review  Outcome: Ongoing, Progressing  Flowsheets (Taken 12/23/2022 1828)  Plan of Care Reviewed With: patient  Progress: no change  Outcome Evaluation: Pt AOx4, VSS, visually impaired, but independent with ADLs and ambulatory. Blood glucose closely monitored. Insulin orders in place. Urinal within reach. Hourly rounds done. Call light within reach. Will continue to monitor.

## 2022-12-24 ENCOUNTER — Other Ambulatory Visit: Payer: Self-pay

## 2022-12-24 LAB — POC GLUCOSE
POC GLUCOSE: 212 mg/dL — ABNORMAL HIGH (ref 70–99)
POC GLUCOSE: 313 mg/dL — ABNORMAL HIGH (ref 70–99)
POC GLUCOSE: 317 mg/dL — ABNORMAL HIGH (ref 70–99)
POC GLUCOSE: 347 mg/dL — ABNORMAL HIGH (ref 70–99)
POC GLUCOSE: 362 mg/dL — ABNORMAL HIGH (ref 70–99)
POC GLUCOSE: 369 mg/dL — ABNORMAL HIGH (ref 70–99)
POC GLUCOSE: 400 mg/dL — ABNORMAL HIGH (ref 70–99)
POC GLUCOSE: 415 mg/dL — ABNORMAL HIGH (ref 70–99)
POC GLUCOSE: 454 mg/dL — ABNORMAL HIGH (ref 70–99)
POC GLUCOSE: 488 mg/dL — ABNORMAL HIGH (ref 70–99)

## 2022-12-24 LAB — CBC NO DIFFERENTIAL
Hematocrit: 44.2 % (ref 41.0–53.0)
Hemoglobin: 14.9 g/dL (ref 13.5–17.5)
MCH: 28.7 pg (ref 27.0–33.0)
MCHC: 33.8 % (ref 32.0–36.0)
MCV: 84.9 fL (ref 80.0–100.0)
MPV: 8.7 fL (ref 6.8–10.0)
Platelet Count: 192 10*3/uL (ref 130–400)
RDW: 14.2 % (ref 0.0–14.7)
Red Blood Cell Count: 5.21 10*6/uL (ref 4.50–5.90)
White Blood Cell Count: 6.7 10*3/uL (ref 4.5–11.0)

## 2022-12-24 LAB — BASIC METABOLIC PANEL
Anion Gap: 13 mmol/L (ref 7–15)
Calcium: 9.8 mg/dL (ref 8.6–10.0)
Carbon Dioxide Total: 23 mmol/L (ref 22–29)
Chloride: 98 mmol/L (ref 98–107)
Creatinine Serum: 1.21 mg/dL — ABNORMAL HIGH (ref 0.51–1.17)
E-GFR Creatinine (Male): 69 mL/min/{1.73_m2}
Glucose: 362 mg/dL — ABNORMAL HIGH (ref 74–109)
Potassium: 4 mmol/L (ref 3.4–5.1)
Sodium: 134 mmol/L — ABNORMAL LOW (ref 136–145)
Urea Nitrogen, Blood (BUN): 27 mg/dL — ABNORMAL HIGH (ref 6–20)

## 2022-12-24 LAB — TACROLIMUS TROUGH (FK506): Tacrolimus (FK506): 3.4 ng/mL — ABNORMAL LOW (ref 5.0–15.0)

## 2022-12-24 LAB — C-PEPTIDE SENDOUT: C-Peptide: <0.1 ng/mL — ABNORMAL LOW (ref 0.5–3.3)

## 2022-12-24 LAB — BETA-HYDROXYBUTYRATE: Beta-Hydroxybutyrate: 0.12 mmol/L (ref 0.02–0.27)

## 2022-12-24 LAB — MAGNESIUM (MG): Magnesium (Mg): 1.8 mg/dL (ref 1.6–2.4)

## 2022-12-24 MED ORDER — INSULIN ASPART (U-100) 100 UNIT/ML (3 ML) SUBCUTANEOUS PEN
1.0000 [IU] | PEN_INJECTOR | Freq: Every day | SUBCUTANEOUS | Status: DC
Start: 2022-12-25 — End: 2022-12-24
  Filled 2022-12-24: qty 300

## 2022-12-24 MED ORDER — INSULIN GLARGINE 100 UNIT/ML SUB-Q VIAL
10.0000 [IU] | Freq: Once | SUBCUTANEOUS | Status: AC
Start: 2022-12-24 — End: 2022-12-24
  Administered 2022-12-24: 10 [IU] via SUBCUTANEOUS

## 2022-12-24 MED ORDER — LANCETS
0 refills | Status: DC
Start: 2022-12-24 — End: 2022-12-25
  Filled 2022-12-24: qty 100, fill #0

## 2022-12-24 MED ORDER — D10W IV BOLUS - HYPOGLYCEMIA
75.0000 mL | INTRAVENOUS | Status: DC | PRN
Start: 2022-12-24 — End: 2022-12-25

## 2022-12-24 MED ORDER — TACROLIMUS 1 MG CAPSULE, IMMEDIATE-RELEASE
2.0000 mg | ORAL_CAPSULE | ORAL | Status: DC
Start: 2022-12-24 — End: 2022-12-25
  Administered 2022-12-24: 2 mg via ORAL
  Filled 2022-12-24: qty 2

## 2022-12-24 MED ORDER — INSULIN GLARGINE 100 UNIT/ML SUB-Q VIAL
5.0000 [IU] | Freq: Once | SUBCUTANEOUS | Status: DC
Start: 2022-12-24 — End: 2022-12-24
  Filled 2022-12-24: qty 1000

## 2022-12-24 MED ORDER — INSULIN GLARGINE 100 UNIT/ML SUB-Q VIAL
10.0000 [IU] | Freq: Once | SUBCUTANEOUS | Status: DC
Start: 2022-12-24 — End: 2022-12-24
  Filled 2022-12-24: qty 1000

## 2022-12-24 MED ORDER — INSULIN GLARGINE 100 UNIT/ML SUB-Q VIAL
15.0000 [IU] | SUBCUTANEOUS | Status: DC
Start: 2022-12-25 — End: 2022-12-24

## 2022-12-24 MED ORDER — INSULIN GLARGINE 100 UNIT/ML SUB-Q VIAL
20.0000 [IU] | SUBCUTANEOUS | Status: DC
Start: 2022-12-25 — End: 2022-12-24

## 2022-12-24 MED ORDER — BLOOD-GLUCOSE METER
0 refills | Status: DC
Start: 2022-12-24 — End: 2022-12-25
  Filled 2022-12-24: qty 1, fill #0

## 2022-12-24 MED ORDER — TACROLIMUS 1 MG CAPSULE, IMMEDIATE-RELEASE
1.0000 mg | ORAL_CAPSULE | Freq: Every day | ORAL | Status: DC
Start: 2022-12-25 — End: 2022-12-25
  Administered 2022-12-25: 1 mg via ORAL
  Filled 2022-12-24: qty 1

## 2022-12-24 MED ORDER — BLOOD-GLUCOSE METER KIT
1.0000 | PACK | Freq: Once | 0 refills | Status: DC
Start: 2022-12-24 — End: 2022-12-24
  Filled 2022-12-24: qty 1, 30d supply, fill #0

## 2022-12-24 MED ORDER — INSULIN REGULAR 100 UNIT/100 ML (1 UNIT/ML) IN 0.9 % NACL IV SOLUTION
0.5000 [IU]/h | INTRAVENOUS | Status: DC
Start: 2022-12-24 — End: 2022-12-25
  Administered 2022-12-24: 0.5 [IU]/h via INTRAVENOUS
  Filled 2022-12-24: qty 100

## 2022-12-24 MED ORDER — ACCU-CHEK AVIVA PLUS TEST STRIPS
ORAL_STRIP | 2 refills | Status: DC
Start: 2022-12-24 — End: 2022-12-25
  Filled 2022-12-24: qty 200, 33d supply, fill #0

## 2022-12-24 NOTE — Progress Notes (Signed)
INTERNAL MEDICINE DAILY PROGRESS NOTE    Date: 12/24/2022 Time: 17:31    CHIEF COMPLAINT  Blurry vision     INTERVAL  -presented to ED w/ blury vision and inability to take insulin   - found to be in HHS.   - labile blood sugars; IGT consulted   - of note, sugars have dropped to 70s after being up in the 300s. Sugars very labile.     SUBJECTIVE  -discussed sugars have been labile and may take some time to get on a stable regimen  - pt otherwise understand plan overall.   - no concerns for now, outside of sugars.     OBJECTIVE    Vitals Signs  Current Vitals  Temp: 36.6 C (97.9 F)  BP: 130/66  Pulse: 79  Resp: 14  SpO2: 98 %     Weight: 73.9 kg (163 lb)    Intake and Output  Last Two Completed Shifts  In: 840 [Oral:840]  Out: -     Current Shift  In: 240 [Oral:240]  Out: -     PHYSICAL EXAM     Physical Exam  Constitutional:       Comments: Sitting up in chair.    HENT:      Head: Normocephalic and atraumatic.   Eyes:      General: No scleral icterus.        Right eye: No discharge.         Left eye: No discharge.      Extraocular Movements: Extraocular movements intact.      Conjunctiva/sclera: Conjunctivae normal.   Cardiovascular:      Rate and Rhythm: Normal rate and regular rhythm.      Heart sounds: No murmur heard.     No friction rub. No gallop.   Pulmonary:      Effort: Pulmonary effort is normal. No respiratory distress.      Breath sounds: No stridor. No wheezing, rhonchi or rales.   Abdominal:      General: There is no distension.      Palpations: Abdomen is soft.      Tenderness: There is no abdominal tenderness. There is no guarding.   Musculoskeletal:      Right lower leg: No edema.      Left lower leg: No edema.   Skin:     General: Skin is warm and dry.      Coloration: Skin is not jaundiced.      Comments: No rash to exposed skin   Neurological:      Mental Status: He is alert.      Comments: Moving all extremities spontaneously. Alert and oriented to self and situation          Labs and  Studies  I have reviewed the following information from the last 24 hours: allied health and treating physician notes, imaging, and labs and microbiology data          Lab Results - 48 hours (excluding micro and POC)   POC GLUCOSE PRN     Status: Abnormal   Result Value Status    POC GLUCOSE 130 (H) Final   POC GLUCOSE PRN     Status: Abnormal   Result Value Status    POC GLUCOSE 207 (H) Final   POC GLUCOSE Q4H     Status: Abnormal   Result Value Status    POC GLUCOSE 297 (H) Final   CBC No Differential     Status:  Normal   Result Value Status    White Blood Cell Count 6.2 Final    Red Blood Cell Count 5.01 Final    Hemoglobin 14.4 Final    Hematocrit 42.6 Final    MCV 85.0 Final    MCH 28.8 Final    MCHC 33.8 Final    RDW 14.1 Final    MPV 8.3 Final    Platelet Count 175 Final   Magnesium (Mg)     Status: Normal   Result Value Status    Magnesium (Mg) 1.7 Final   Tacrolimus Trough (FK506)     Status: Abnormal   Result Value Status    Tacrolimus (FK506) 4.3 (L) Final   POC GLUCOSE PRN     Status: Abnormal   Result Value Status    POC GLUCOSE 362 (H) Final   POC GLUCOSE PRN     Status: Abnormal   Result Value Status    POC GLUCOSE 348 (H) Final   POC GLUCOSE PRN     Status: Abnormal   Result Value Status    POC GLUCOSE 287 (H) Final   POC GLUCOSE ONCE     Status: Abnormal   Result Value Status    POC GLUCOSE 517 (Crth) Final   POC GLUCOSE PRN     Status: Abnormal   Result Value Status    POC GLUCOSE 259 (H) Final   POC GLUCOSE PRN     Status: Abnormal   Result Value Status    POC GLUCOSE 207 (H) Final   POC GLUCOSE PRN     Status: Abnormal   Result Value Status    POC GLUCOSE 282 (H) Final   CBC No Differential     Status: Normal   Result Value Status    White Blood Cell Count 6.7 Final    Red Blood Cell Count 5.21 Final    Hemoglobin 14.9 Final    Hematocrit 44.2 Final    MCV 84.9 Final    MCH 28.7 Final    MCHC 33.8 Final    RDW 14.2 Final    MPV 8.7 Final    Platelet Count 192 Final   Magnesium (Mg)     Status:  Normal   Result Value Status    Magnesium (Mg) 1.8 Final   POC GLUCOSE PRN     Status: Abnormal   Result Value Status    POC GLUCOSE 400 (H) Final   Tacrolimus Trough (FK506)     Status: Abnormal   Result Value Status    Tacrolimus (FK506) 3.4 (L) Final   POC GLUCOSE PRN     Status: Abnormal   Result Value Status    POC GLUCOSE 415 (H) Final   POC GLUCOSE PRN     Status: Abnormal   Result Value Status    POC GLUCOSE 313 (H) Final   POC GLUCOSE PRN     Status: Abnormal   Result Value Status    POC GLUCOSE 454 (H) Final   POC GLUCOSE PRN     Status: Abnormal   Result Value Status    POC GLUCOSE 488 (H) Final     IMPRESSION:  1. Transplant renal artery stenosis screening risk category: Low. See  management recommendations below.    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 Work up  in ED showed HHS. Not DKA. 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   > Upon arrival to the ED, patients glucose level was >800mg /dl. Patient has been unable to self administer insulin due to vision changes.No evidence of DKA. S/p insulin gtt, lantus, IVF. Patient is currently off insulin gtt at time of admission with POC glucose ranging between 70-110mg /dl. 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    > hgb A1c 12.7  > kept on D10 overnight, w/ rebound hyperglycemia. Now Dced.   - IGT consulted   - Continue with Basal Insulin Glargine (Lantus) 15  units SQ qAM  - Continue with Nutritional Aspart (Novolog) 6 units TID AC (holding now hypoglycemic)   - change to sensitive Correctional Aspart (Novolog) Scale SQ w/ meals  - carb count w/ insulin for meals and snacks.   - Carb controlled  diet  - hypoglycemia protocol   - working on getting talking glucometer.      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 He describes it as a red fog; he is able to see it through it but his vision is now much more limited. He also sees some spots. Denies flashes, dark curtain/shadow.   -Ophthalmology consulted-- - Dilated exam showed diffusely hazy view  - B-scan ultrasound showed vitreous hemorrhage without retinal detachment   - Most likely etiology is diabetic retinopathy in the setting of uncontrolled diabetes  - Recommend minimal activity, sleep with head of bed elevated, avoid non medically necessary blood thinners and NSAIDs  - Return precautions discussed and emergency line reiterated  - Upon discharge, patient should follow-up with the Retina Service in 1 week-referral placed   - Recommend lensometer reading of spectacles at that time to determine degree of myopia. Once vit heme has resolved should establish care with our retina service     AKI. Resolved.   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 Likely secondary to HHS. Unknown baseline. Current sCr 0.92. Of note, patient is s/p kidney transplant in 2011.   > tacrolimus level 3/23 < 2.0. now 4.3. suspect possible non compliance.   > transplant kidney US w/ low risk of RAS  -Monitor daily BMP  -glucose correction as noted as above   - cont home tacrolimus 1mg  BID  - con thome cellcept 750 BID  - follow-up tacrolimus level tomorrow   - tranplant nephro consulted. - cont tacro as above.   - will discuss prednisone w/ transplant team as pt states he is on that.                 HLD  Continue atorvastatin      Hypothyroidism  > TSH  within normal limits   Continue levothyroxine     HTN  Continue PTA lisinopril     FEN: DIABETIC DIET - MALE  SPECIAL TRAY REQUEST  SPECIAL TRAY REQUEST  SPECIAL TRAY REQUEST  SPECIAL TRAY REQUEST  SPECIAL TRAY REQUEST  SPECIAL  TRAY REQUEST  SNACKS  DVT Prophylaxis: SCD in setting of vitreous hemorrhage.   Advanced Care Planning/Goals of Care: Full code   Disposition: pending PT/OT eval    Updated sister Joelene Millin in phone 12/22/22    Total time I spent in care of this patient today (excluding time spent on other billable services) was 60 minutes. This time does not overlap with other providers involved in this patient's care.       Report Electronically Signed by: Mayford Knife, [facility] Medicine Faculty Service

## 2022-12-24 NOTE — Progress Notes (Addendum)
Inpatient Glycemic Team Progress Note  Consulting Service:  Endocrinology  HD: 2     Name:Kerry Mercado  MRN: [id number]  DOB: 08/06/1966    Note Started: 12/24/2022 06:40 Date of Service: 12/24/2022   Date of Admission: 12/20/2022 Current Attending: Kendell Bane*        Addendum 15:35:   - Give one time dose of Basal Insulin Glargine (Lantus) 10 units SQ QHS *now*  - Change to Basal Insulin Glargine (Lantus) 15 units SQ Q12H starting tomorrow 3/37/24 morning  - Change to Nutritional Aspart (Novolog) Insulin to Carb Ratio (ICR) 1:8 TID AC  - Change to Snack Nutritional Aspart (Novolog) Insulin to Carb Ratio (ICR) 1:8 TID in between meals  - Continue Average Correctional Aspart (Novolog) Scale SQ TID AC  - Add Average Correctional Aspart (Novolog) Scale SQ at 02:00    Addendum 13:15: Patient's prelunch POC BG 313mg /dL, recommend:  - Give one time dose of Basal Insulin Glargine (Lantus) 10 units SQ QHS tonight at 21:00  - Change to Basal Insulin Glargine (Lantus) 15 units SQ Q12H starting tomorrow 3/37/24 morning  - Continue with Nutritional Aspart (Novolog) Insulin to Carb Ratio (ICR) 1:10 TID AC  - Continue with Snack Nutritional Aspart (Novolog) Insulin to Carb Ratio (ICR) 1:10 TID in between meals  - Continue Average Correctional Aspart (Novolog) Scale SQ TID AC  - Add Average Correctional Aspart (Novolog) Scale SQ at 02:00    Recommendation:  - Give one time dose of Basal Insulin Glargine (Lantus) 5 units SQ *now*  - Increase Basal Insulin Glargine (Lantus)  20 units SQ qAM  - Continue with Nutritional Aspart (Novolog) Insulin to Carb Ratio (ICR) 1:10 TID AC  - Continue with Snack Nutritional Aspart (Novolog) Insulin to Carb Ratio (ICR) 1:10 TID in between meals  - Change to Average Correctional Aspart (Novolog) Scale SQ TID AC     Assessment/Plan     The patient's AM POC BG this morning was 400mg /dL, recommend increased basal and correctional insulin.    Glycemic management is not within  (100-180mg /dl) at this time.    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    Insulin CDS - Subcutaneous Dosing: Please refer to CDS tool for dosing used for physiologic insulin regimen:Insulin CDS - Subcutaneous Dosing     If there are any plans for patient to go to surgery in am,or NPO after midnight:  - decrease current basal insulin dose by 20% the evening prior to becoming NPO  - hold scheduled Nutritional Novolog (as NPO)  - change current Correctional scale to q 4 hours while NPO  - monitor POC glucose to q4 hours while NPO     Preliminary Discharge Recommendations:  TBD*  - Tresiba pen ($11 /mo) 25 units SQ QHS  - Aspart (Novolog) pen Or Covered Alternative Insulin to Carbohydrate Ratio 1:10 TID AC   + Average Correctional Scale  TID AC  - Inhaled Glucagon (Baqsimi) PRN hypoglycemia  - IM Adult Diabetes Discharge Order set [phone]) - will need a "talking glucometer" , lancets, test strips on DC     Establish and Follow up with PCP for :  - further diabetes medication dose adjustments  - referral to Endocrinology (placed for you - patient is interested in continuous glucose monitoring = CGM sensor and insulin pump) - was previously on an insulin pump and CGM (last time ~ 2012) prior to pancreas transplant     Summary:   57 year old male with Type 1 diabetes uncontrolled (HgbA1c 12.7% on Q000111Q) complicated by retinopathy and peripheral neuropathy admitted for Right eye Vitreous Hemorrhage and severe hyperglycemia  (initial glucose of 828mg /dl) with Past Medical History significant for 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 Inpatient Glycemic Team was consulted for glycemic management.      Diabetes Type 1 x 33 years. 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 (0000000)  Diabetic complications: retinopathy and peripheral neuropathy   Followed by PCP  in [place] - last seen " a few months ago"  Last seen by Endo in [place] (patient cannot recall name) - Jan 2024    Interval History:   - 12/24/22: AM POC BG 400mg /dL    Recent labs for the past 24 hours     12/23/22  0724 12/23/22  1147 12/23/22  1400 12/23/22  1603 12/23/22  1710 12/23/22  1952   PGLU 348* 287* 517* 259* 207* 282*      Last 24 Hour Range: 207 - 517    Current Diet Order:   PO Diet    DIABETIC DIET - MALE          Current Inpatient Insulin Regimen:   Basal: Glargine (Lantus)  12 units SQ qAM  Nutritional: Aspart (Novolog) Insulin to Carb Ratio (ICR) 1:10 TID AC with meals and snacks  Correctional: Sensitive Correctional Aspart (Novolog) Scale SQ TID AC  Total Daily Dose of Insulin (TDDI) in the last 24 hours: ~ 53 units    I have reviewed the Glycemic Management Summary and have noted the following blood glucose trends:     Subjective   Subjective      Patient upset that he is "not receiving enough insulin" and declined noon insulin. Is amenable to receiving current nutritional and correctional insulin ordered at lunch and changing basal to Q12H. Plan for recheck 2 hours postprandial.    Denies any chest pain, dyspnea, N/V, abdominal pain  No reports of polyuria, polydipsia, polyphagia.    Home DM Regimen: Diabetes Home Regimen:  On 12/21/22 reported taking:  - Lantus 20 units SQ BID  (last dose was on 12/20/22 in am prior to admission)  - Meal time insulin of 15 units TID [facility]     Per chart review  Office Visit 10/25/19:   - Basaglar 20 units SQ qHS  - Humalog 15 units for Breakfast; 18 units for Lunch; 18 units for dinner;   - 12 units for HS snacks  - Plus a scale with meals     DC Summary from [facility] (11/2018):-  - Lantus 15 units SQ daily  - Meal time Insulin:  Breakfast -15 units  Lunch -  18 units  Dinner - 18 units  - And Snack coverage - 12 units  - Correctional scale of 3 units for every 50mg /dl above 150mg /dl     -POC Glucose Checks: 3-4x a day; usual readings in the 200s range  -Hypoglycemia events: at least once a week  --Tx of hypoglycemia is with food; juice.  --Pt has needed emergency assistance for hypoglycemia in the past: denies     Inpatient Medications   Scheduled:     Atorvastatin (LIPITOR) Tablet 10 mg    Fluoxetine (PROZAC) Capsule/Tablet 40 mg    Insulin Aspart (NOVOLOG) Injection Pen 1-20 Units    Insulin Aspart (NOVOLOG) Injection Pen 1-25 Units    Insulin Aspart (NOVOLOG) Injection Pen 1-25 Units    Insulin Glargine (LANTUS) 100 unit/mL Injection 15 Units    LevoTHYROxine Tablet 75 mcg    Mycophenolate (MMF) (CELLCEPT) Capsule 750 mg    Pantoprazole (PROTONIX) Delayed Release Tablet 40 mg    Polyethylene Glycol 3350 (MIRALAX) Oral Powder Packet 17 g    Tacrolimus (FK-506) (PROGRAF) Capsule 1 mg    PRN:     Acetaminophen (TYLENOL) Tablet 650 mg    Bisacodyl (DULCOLAX) EC Tablet 10 mg    Camphor/Menthol (SARNA) Lotion    Capsaicin (ZOSTRIX) 0.025 % Cream    Dextrose 10% 10-20 g IVPB 100-200 mL    Diphenhydramine-Zinc Acetate (BENADRYL ITCH-RELIEF) 1-0.1 % Cream    Glucagon Injection 1 mg    Glucose Chewable Tablet 12-24 g    Melatonin Tablet 3 mg    Ondansetron (ZOFRAN) Injection 4 mg    Prochlorperazine (COMPAZINE) Injection 5-10 mg    Simethicone (GAS-X) Chewable Tablet 80 mg    Continuous:     [Held by provider] D5 / 0.45% NaCl Infusion     Allergies:  Shellfish derived      Objective   Objective       Vitals:  Vital Signs have been reviewed   Most Recent:BP 135/82   Pulse 83   Temp 36.6 C (97.9 F) (Oral)   Resp 16   Ht 1.829 m (6')   Wt 73.9 kg (163 lb)   SpO2 100%   BMI 22.11 kg/m     Results: The following results have been reviewed:Labs     Labs - Past 72 Hours     12/22/22  0526   CREATININE BLOOD 1.06   GLUCOSE 379*   UREA NITROGEN, BLOOD (BUN) 15   SODIUM 135*   POTASSIUM 3.9   CHLORIDE 102   CARBON DIOXIDE TOTAL 24   CALCIUM 7.6*        Hgb A1C (%)   Date Value   12/21/2022 12.7     Intake and Output Last Shift:  No intake/output data recorded.    Physical Exam  Vitals and nursing note reviewed.   Constitutional:       General: He is awake.      Appearance: He is normal weight.   HENT:      Head: Normocephalic.      Nose: Nose normal.      Mouth/Throat:      Mouth: Mucous membranes are moist.      Pharynx: Oropharynx is clear.   Eyes:      Conjunctiva/sclera: Conjunctivae normal.   Cardiovascular:      Rate and Rhythm: Normal rate.   Pulmonary:      Effort: Pulmonary effort is normal.   Abdominal:      Palpations: Abdomen is soft.  Skin:     General: Skin is warm and dry.   Neurological:      Mental Status: He is alert and oriented to person, place, and time.           The above recommendations were given to Dr. Marina Gravel of the ([facility] [place] service to optimize the patient's glycemic regimen.    Patient was seen and case discussed with Endocrinology attending Dr. Elroy Channel and the above plan was formulated in part with them, who agreed with above recommendations.    Total time I spent in care of this patient today (excluding time spent on other billable services) was >35 minutes. This time does not overlap with other providers involved in this patient's care.     Anson Crofts, NP       Inpatient Glycemic Team  [employer] [phone])  (After hours- Endo Fellow On Call)    I have personally performed a  face to face diagnostic evaluation on this patient.  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 Inpatient Glycemic Team was consulted for glycemic management.    The patient was feeling frustrated.  Insulin orders were d/c'd in AM after low BG night before.  Started back basal in AM but needed additional amount.  RN lost vial and needed to obtain a new vial from pharmacy.  The patients lunch tray was cold and due to frustration he was refusing insulin.  We discussed and decided with the patient to make Lantus BID and to give ICR (nutritional and correctional) upfront before meal.     He continued with hyperglycemia and given Lantus 10 units, changed to more aggressive ICR and correction.      VSS and nursing notes reviewed.  General Appearance: No distress. Thin WM frustrated.  HEENT: Normocephalic. Poor visual acuity.    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    Detailed discussion with the patient.     Total time I spent in care of this patient today (excluding time spent on other billable services) was >35  minutes. This time does not overlap with other providers involved in this patient's care.     Jolene Provost,  MD  Associate Clinical Professor of Medicine  Director, Inpatient Glycemic Team  Division of Endocrinology, Diabetes and Metabolism  [employer]

## 2022-12-24 NOTE — Nurse Assessment (Signed)
ASSESSMENT NOTE    Note Started: 12/24/2022, 19:28     Initial assessment completed and recorded in EMR.  Report received from day shift nurse and orders reviewed. Plan of Care reviewed and appropriate, discussed with patient. No s/s of distress. FS=317 and will start insulin drip. Call light within reach. Will cont to monitor.  Pearlean Brownie, RN

## 2022-12-24 NOTE — Clinical Case Management (Addendum)
Clinical Case Management Documentation    Name: Kerry Mercado  MRN: [id number]   Date of Birth: [birth year] (68yr) Gender: male          Current Isolation(s): No active isolations  Current Infection(s): No active infections      INITIAL ASSESSMENT NOTE    Patient was transferred from Networked reference to record EAF .       [place], [place] (Confirmed)    Anticipated Discharge Address: Home As Above    Patient can follow-up with:   PCP: Sherrin Daisy, Than  / Phone Number: [phone]  Preferred Pharmacy:   [facility], [place], [place] [address]  [phone]    Funding/Billing: Payor: MEDICARE / Plan: MEDICARE PART A&B Secondary Insurance: [place]       Patient able to participate in plan?: Yes  Is an interpreter used?: No  Living arrangements: Alone  Type of Residence: private residence  Is caregiver willing/able to assist this patient with daily activities? (feeding, transferring in/out of bed/chair, dressing, bathing & other daily activities?: No  Is caregiver willing/able to receive training to support this patient for above activities?: No  Developmental Level Appropriate (Pediatrics): N/A  CCS (Pediatrics): N/A  Pre-Hospitalization self-care deficits: None  Pre-Hospitalization mobility: None  Bladder function: Continent  Bowel function: Continent  Pre-Hospital Services: None  Type of home health care services in place: None  Anticipated Disposition: Home  DME in place: None  Does the patient have ongoing DC Planning needs?: Yes  Is the patient ready for discharge today?: No     No post-discharge plans have been finalized at this time.    Comments: After Hours CM Note:  Reviewed case and per team: 57yr old male, with history of HTN, HLD, DM2, Hypothyroidism, 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     CM met with pt at bedside who confirmed home address as above  [facility] with four steps to enter).  Pt, prior to hospitalization, was able to manage mobility without the use of DME.    Pt pending further medical evaluation and treatment.    Anticipate discharge to home once medically stable.      Follow-up/recommendations:     CM will continue to follow clinical progress and assess for discharge needs. Will continue to collaborate with multidisciplinary team and update discharge plan accordingly.      Date/Time: 12/24/2022 12:46  Electronically Signed by:   Rosendo Gros, [place], CCM  Clinical Case Manager  [facility] [place] [facility]   Phone: [phone]   [facility] Rosendo Gros) Preferred Way To Communicate     [facility] Text): Rosendo Gros     Regular hours 12:30PM-11:00PM      **After office hours or during weekends and holidays, please refer to the CCM daily assignment sheet for the covering Case Manager.

## 2022-12-24 NOTE — Discharge Planning (AHS/AVS) (Signed)
Here are some community resources should you need them in the future:    If you have questions about your private medical insurance including [employer] plans, call the customer service number located on your insurance card.    Community Resources [place]  www.[place]hs.[place]    Medicare  www.medicare.gov  (800) 633-4227    [place] Department of Aging (CDA)  www.aging.[place].gov  (800) 510-2020    Medi-Cal  [phone] 874-9670 For [place] Only  For all other counties, refer to web site:  www.[place].com/covered-[place]/health-insurance-companies/medi-cal    Long Term Care Ombudsman  Crisis Line  (800) 231-4024    If you have any questions related to your discharge, please contact the Department of Clinical Case Management at [phone].

## 2022-12-24 NOTE — Progress Notes (Signed)
PHARMACIST THERAPEUTIC DRUG MONITORING    Patient Name: Kerry Mercado  MRN: [id number]    Medication: tacrolimus    Indication: LDRT 2011     Goal level: 4-6 ng/mL (pending clarification per Transplant nephrology)    Most recent level:   Recent labs for the past 144 hours     12/21/22  0140 12/23/22  0518 12/24/22  0753   TACROLIMUS <2.0* 4.3* 3.4*     Dose associated with level: 1 mg every 12 hours (3/25 @ 2054)     Pertinent Drug-Drug Interactions: NA    Assessment and Plan: Most recent level is likely subtherapeutic, drawn appropriately (true trough). Recommend to continue the current dosing regimen of 1 mg every 12 hours. Defer to transplant nephrology to determine appropriate goal trough level.    Pharmacist will continue to follow along for any additional dose adjustments as needed.      Signed By:   Royden Purl, PharmD, BCPS  Clinical Pharmacist, Internal Medicine  [facility] [phone]) or [place]    Department of Pharmacy Services

## 2022-12-24 NOTE — Care Plan (Signed)
Problem: Adult Inpatient Plan of Care  Goal: Plan of Care Review  Outcome: Ongoing, Progressing  Flowsheets  Taken 12/24/2022 1802 by Hosie Spangle, RN  Plan of Care Reviewed With: patient  Outcome Evaluation: A&Ox4, uncooperative and labile. Left eye blind and right eye has blurry vision. Ambulates to the restroom independently. MD and NP aware of patients blood glucose results. Judson Roch, NP at bedside to discuss plan of care with patient at his request. MD aware of refusal of medications and vital signs. Plan to continue to monitor blood glucose.  Taken 12/24/2022 0425 by Arvil Persons, RN  Progress: no change

## 2022-12-24 NOTE — Care Plan (Signed)
Problem: Adult Inpatient Plan of Care  Goal: Plan of Care Review  12/24/2022 0427 by Arvil Persons, RN  Outcome: Ongoing, Progressing  Flowsheets (Taken 12/24/2022 0425)  Outcome Evaluation: A/O x 4. VSS on RA. Pt is independent with ADLs. PRN tylenol given for arthritic pain and helpful. Continue with plan of care.

## 2022-12-24 NOTE — Nurse Focus (Signed)
FS=265 @ 2245

## 2022-12-24 NOTE — Nurse Assessment (Signed)
ASSESSMENT NOTE    Note Started: 12/24/2022, 07:00     Initial assessment completed and recorded in EMR.  Report received from night shift nurse and orders reviewed. Plan of Care reviewed and appropriate, discussed with patient.  Hosie Spangle, RN

## 2022-12-24 NOTE — Nurse Focus (Addendum)
Notified Sarah, NP that patient is refusing the x1 lantus and insulin due to feeling like his blood sugar is not being controlled. Judson Roch, NP at bedside to speak with patient. Patient refusing vital signs. MD aware.

## 2022-12-24 NOTE — Nurse Focus (Signed)
Notified Personius, MD that patient said to come back later for lab draw.

## 2022-12-24 NOTE — Nurse Assessment (Signed)
Patient is refusing nutritional insulin. Judson Roch, NP aware.

## 2022-12-25 ENCOUNTER — Other Ambulatory Visit: Payer: Self-pay

## 2022-12-25 LAB — POC GLUCOSE
POC GLUCOSE: 104 mg/dL — ABNORMAL HIGH (ref 70–99)
POC GLUCOSE: 107 mg/dL — ABNORMAL HIGH (ref 70–99)
POC GLUCOSE: 115 mg/dL — ABNORMAL HIGH (ref 70–99)
POC GLUCOSE: 122 mg/dL — ABNORMAL HIGH (ref 70–99)
POC GLUCOSE: 129 mg/dL — ABNORMAL HIGH (ref 70–99)
POC GLUCOSE: 140 mg/dL — ABNORMAL HIGH (ref 70–99)
POC GLUCOSE: 159 mg/dL — ABNORMAL HIGH (ref 70–99)
POC GLUCOSE: 181 mg/dL — ABNORMAL HIGH (ref 70–99)
POC GLUCOSE: 204 mg/dL — ABNORMAL HIGH (ref 70–99)
POC GLUCOSE: 208 mg/dL — ABNORMAL HIGH (ref 70–99)
POC GLUCOSE: 215 mg/dL — ABNORMAL HIGH (ref 70–99)
POC GLUCOSE: 217 mg/dL — ABNORMAL HIGH (ref 70–99)
POC GLUCOSE: 256 mg/dL — ABNORMAL HIGH (ref 70–99)
POC GLUCOSE: 270 mg/dL — ABNORMAL HIGH (ref 70–99)
POC GLUCOSE: 277 mg/dL — ABNORMAL HIGH (ref 70–99)
POC GLUCOSE: 51 mg/dL — CL (ref 70–99)
POC GLUCOSE: 60 mg/dL — ABNORMAL LOW (ref 70–99)
POC GLUCOSE: 69 mg/dL — ABNORMAL LOW (ref 70–99)
POC GLUCOSE: 99 mg/dL (ref 70–99)

## 2022-12-25 LAB — BASIC METABOLIC PANEL
Anion Gap: 13 mmol/L (ref 7–15)
Anion Gap: 14 mmol/L (ref 7–15)
Calcium: 9.1 mg/dL (ref 8.6–10.0)
Calcium: 9.7 mg/dL (ref 8.6–10.0)
Carbon Dioxide Total: 24 mmol/L (ref 22–29)
Carbon Dioxide Total: 24 mmol/L (ref 22–29)
Chloride: 99 mmol/L (ref 98–107)
Chloride: 101 mmol/L (ref 98–107)
Creatinine Serum: 1.06 mg/dL (ref 0.51–1.17)
Creatinine Serum: 1.1 mg/dL (ref 0.51–1.17)
E-GFR Creatinine (Male): 81 mL/min/{1.73_m2}
E-GFR Creatinine (Male): 78 mL/min/{1.73_m2}
Glucose: 213 mg/dL — ABNORMAL HIGH (ref 74–109)
Glucose: 74 mg/dL (ref 74–109)
Potassium: 3.6 mmol/L (ref 3.4–5.1)
Potassium: 3.6 mmol/L (ref 3.4–5.1)
Sodium: 136 mmol/L (ref 136–145)
Sodium: 139 mmol/L (ref 136–145)
Urea Nitrogen, Blood (BUN): 25 mg/dL — ABNORMAL HIGH (ref 6–20)
Urea Nitrogen, Blood (BUN): 23 mg/dL — ABNORMAL HIGH (ref 6–20)

## 2022-12-25 LAB — HEMOGLOBIN A1C
Hgb A1C,Glucose Est Avg: 315 mg/dL
Hgb A1C: 12.6 % — ABNORMAL HIGH (ref 3.9–5.6)

## 2022-12-25 LAB — TACROLIMUS TROUGH (FK506): Tacrolimus (FK506): 3.6 ng/mL — ABNORMAL LOW (ref 5.0–15.0)

## 2022-12-25 LAB — MAGNESIUM (MG): Magnesium (Mg): 1.8 mg/dL (ref 1.6–2.4)

## 2022-12-25 MED ORDER — TACROLIMUS 1 MG CAPSULE, IMMEDIATE-RELEASE
2.0000 mg | ORAL_CAPSULE | Freq: Two times a day (BID) | ORAL | Status: DC
Start: 2022-12-25 — End: 2022-12-27
  Administered 2022-12-25 – 2022-12-26 (×3): 2 mg via ORAL
  Filled 2022-12-25 (×3): qty 2

## 2022-12-25 MED ORDER — INSULIN ASPART (U-100) 100 UNIT/ML (3 ML) SUBCUTANEOUS PEN
10.0000 [IU] | PEN_INJECTOR | Freq: Three times a day (TID) | SUBCUTANEOUS | Status: DC
Start: 2022-12-25 — End: 2022-12-27
  Administered 2022-12-25 – 2022-12-26 (×4): 10 [IU] via SUBCUTANEOUS

## 2022-12-25 MED ORDER — GLUCOSE 4 GRAM CHEWABLE TABLET
12.0000 g | CHEWABLE_TABLET | ORAL | Status: DC | PRN
Start: 2022-12-25 — End: 2022-12-25

## 2022-12-25 MED ORDER — INSULIN ASPART (U-100) 100 UNIT/ML (3 ML) SUBCUTANEOUS PEN
12.0000 [IU] | PEN_INJECTOR | Freq: Three times a day (TID) | SUBCUTANEOUS | Status: DC
Start: 2022-12-25 — End: 2022-12-25
  Filled 2022-12-25: qty 300

## 2022-12-25 MED ORDER — GLUCAGON HCL 1 MG/ML SOLUTION FOR INJECTION
1.0000 mg | INTRAMUSCULAR | Status: DC | PRN
Start: 2022-12-25 — End: 2022-12-25

## 2022-12-25 MED ORDER — INSULIN DEGLUDEC (U-100) 100 UNIT/ML (3 ML) SUBCUTANEOUS PEN
30.0000 [IU] | PEN_INJECTOR | Freq: Every day | SUBCUTANEOUS | 0 refills | Status: AC
Start: 2022-12-25 — End: 2023-12-20

## 2022-12-25 MED ORDER — INSULIN GLARGINE 100 UNIT/ML SUB-Q VIAL
15.0000 [IU] | SUBCUTANEOUS | Status: DC
Start: 2022-12-25 — End: 2022-12-27
  Administered 2022-12-25 – 2022-12-26 (×3): 15 [IU] via SUBCUTANEOUS

## 2022-12-25 MED ORDER — INSULIN ASPART (U-100) 100 UNIT/ML (3 ML) SUBCUTANEOUS PEN
10.0000 [IU] | PEN_INJECTOR | Freq: Three times a day (TID) | SUBCUTANEOUS | 0 refills | Status: AC
Start: 2022-12-25 — End: 2023-01-24

## 2022-12-25 MED ORDER — INSULIN ASPART (U-100) 100 UNIT/ML (3 ML) SUBCUTANEOUS PEN
2.0000 [IU] | PEN_INJECTOR | SUBCUTANEOUS | Status: DC
Start: 2022-12-25 — End: 2022-12-27
  Filled 2022-12-25: qty 300

## 2022-12-25 MED ORDER — FREESTYLE LIBRE 3 SENSOR DEVICE
0 refills | Status: DC
Start: 2022-12-25 — End: 2022-12-25
  Filled 2022-12-25: qty 1, 30d supply, fill #0

## 2022-12-25 MED ORDER — BLOOD-GLUCOSE METER
1.0000 | Freq: Once | 0 refills | Status: DC
Start: 2022-12-25 — End: 2022-12-26

## 2022-12-25 MED ORDER — GLUCOSE 4 GRAM CHEWABLE TABLET
12.0000 g | CHEWABLE_TABLET | ORAL | Status: DC | PRN
Start: 2022-12-25 — End: 2022-12-27
  Administered 2022-12-25: 24 g via ORAL
  Filled 2022-12-25: qty 1

## 2022-12-25 MED ORDER — GLUCAGON HCL 1 MG/ML SOLUTION FOR INJECTION
1.0000 mg | INTRAMUSCULAR | Status: DC | PRN
Start: 2022-12-25 — End: 2022-12-27

## 2022-12-25 MED ORDER — FREESTYLE LIBRE 3 READER
0 refills | Status: DC
Start: 2022-12-25 — End: 2022-12-25
  Filled 2022-12-25: qty 1, 30d supply, fill #0

## 2022-12-25 MED ORDER — TACROLIMUS 1 MG CAPSULE, IMMEDIATE-RELEASE
1.0000 mg | ORAL_CAPSULE | Freq: Two times a day (BID) | ORAL | Status: DC
Start: 2022-12-25 — End: 2022-12-25

## 2022-12-25 MED ORDER — BLOOD-GLUCOSE METER KIT
1.0000 | PACK | Freq: Once | 0 refills | Status: AC
Start: 2022-12-25 — End: 2023-12-20
  Filled 2022-12-25: qty 1, 30d supply, fill #0

## 2022-12-25 MED ORDER — INSULIN ASPART (U-100) 100 UNIT/ML (3 ML) SUBCUTANEOUS PEN
1.0000 [IU] | PEN_INJECTOR | Freq: Three times a day (TID) | SUBCUTANEOUS | Status: DC
Start: 2022-12-25 — End: 2022-12-25

## 2022-12-25 MED ORDER — D10W IV BOLUS - HYPOGLYCEMIA
10.0000 g | INTRAVENOUS | Status: DC | PRN
Start: 2022-12-25 — End: 2022-12-25

## 2022-12-25 MED ORDER — FORA V10 STRIPS
ORAL_STRIP | 11 refills | Status: DC
Start: 2022-12-25 — End: 2022-12-26

## 2022-12-25 MED ORDER — TACROLIMUS 1 MG CAPSULE, IMMEDIATE-RELEASE
1.0000 mg | ORAL_CAPSULE | Freq: Once | ORAL | Status: AC
Start: 2022-12-25 — End: 2022-12-25
  Administered 2022-12-25: 1 mg via ORAL
  Filled 2022-12-25: qty 1

## 2022-12-25 MED ORDER — D10W IV BOLUS - HYPOGLYCEMIA
10.0000 g | INTRAVENOUS | Status: DC | PRN
Start: 2022-12-25 — End: 2022-12-27

## 2022-12-25 MED ORDER — INSULIN GLARGINE 100 UNIT/ML SUB-Q VIAL
20.0000 [IU] | SUBCUTANEOUS | Status: DC
Start: 2022-12-25 — End: 2022-12-25
  Administered 2022-12-25: 20 [IU] via SUBCUTANEOUS
  Filled 2022-12-25: qty 1000

## 2022-12-25 MED ORDER — LANCING DEVICE
1.0000 | Freq: Every day | 0 refills | Status: DC | PRN
Start: 2022-12-25 — End: 2022-12-26

## 2022-12-25 MED ORDER — INSULIN ASPART (U-100) 100 UNIT/ML (3 ML) SUBCUTANEOUS PEN
12.0000 [IU] | PEN_INJECTOR | Freq: Three times a day (TID) | SUBCUTANEOUS | Status: DC
Start: 2022-12-25 — End: 2022-12-25
  Administered 2022-12-25 (×2): 12 [IU] via SUBCUTANEOUS
  Filled 2022-12-25: qty 300

## 2022-12-25 MED ORDER — FORA V10-V12-D10-D20 STRIPS-LANCETS 30 GAUGE COMBO PACK
420.0000 | PACK | Freq: Every day | 0 refills | Status: DC | PRN
Start: 2022-12-25 — End: 2022-12-26

## 2022-12-25 MED ORDER — INSULIN ASPART (U-100) 100 UNIT/ML (3 ML) SUBCUTANEOUS PEN
1.0000 [IU] | PEN_INJECTOR | Freq: Three times a day (TID) | SUBCUTANEOUS | Status: DC
Start: 2022-12-25 — End: 2022-12-27
  Administered 2022-12-25 (×2): 3 [IU] via SUBCUTANEOUS
  Administered 2022-12-26: 2 [IU] via SUBCUTANEOUS
  Administered 2022-12-26: 3 [IU] via SUBCUTANEOUS
  Filled 2022-12-25: qty 300

## 2022-12-25 NOTE — Progress Notes (Addendum)
Inpatient Glycemic Team Progress Note  Consulting Service:  Endocrinology  HD: 3     Name:Hence Waite Mischler  MRN: [id number]  DOB: 1966/03/25    Note Started: 12/25/2022 07:40 Date of Service: 12/25/2022   Date of Admission: 12/20/2022 Current Attending: Renee Ramus Mich*        Recommendations (addendum @ 1409):  - Decrease Basal Insulin Glargine (Lantus)  15 units SQ q 12 hours  - Decrease Nutritional Aspart (Novolog)  10 units TID AC  - Change to Sensitive Correctional Aspart (Novolog) Scale SQ TID AC     Recommendations:  - Start Basal Insulin Glargine (Lantus)  20 units SQ q 12 hours (first dose now)  - DC insulin infusion 2 hours after first dose of Lantus 20 units SQ given  - Start Nutritional Aspart (Novolog) 12 units TID AC  - Start Average Correctional Aspart (Novolog) Scale SQ TID AC  Assessment/Plan     57 year old male with Hx of Left eye vision loss, Type 1 diabetes uncontrolled (HgbA1c 12.7% on 11/2022; previous A1c per chart review of 11% in 11/2018) admitted for Right eye Vitreous Hemorrhage who had insulin infusion restarted (12/24/22) for persistent hyperglycemia. Of note, patient had already received a total Lantus dose of 25 untis (on 12/24/22).  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  Currently the Patient's nutrition Rx is: 60-75 gms carb/meal (recommended for men)   Current Diet Order:   PO Diet    DIABETIC DIET - MALE   Addendum @ 1409:  Noted hypoglycemia possibly due to mismatch of insulin doses to carbohydrate intake.  Will decrease basal bolus insulin  doses.  See above recommendations.    If NPO:  - decrease current basal insulin dose by 20%  - hold scheduled Nutritional Novolog   - change current Correctional scale to q 4hours     Discharge Recommendations:  - Tresiba pen ($11 /mo) 30 units SQ QHS  - Aspart (Novolog) pen Or Covered Alternative 10 units SQ TID AC  Plus Correctional scale with Novolog 1 unit for every 50mg /dl above 150mg /dl tid ac  - Inhaled Glucagon (Baqsimi) PRN hypoglycemia  - IM Adult Diabetes Discharge Order set [phone]) - will need a "talking glucometer" , lancets, test strips on DC     Establish and Follow up with PCP for :  - further diabetes medication dose adjustments  - referral to Endocrinology (placed for you - patient is interested in continuous glucose monitoring = CGM sensor and insulin pump) - was previously on an insulin pump and CGM (last time ~ 2012) prior to pancreas transplant    Summary:   57 year old male with Hx of Left eye vision loss, Type 1 diabetes uncontrolled (HgbA1c pending, previous A1c per chart review of 11% in 11/2018) admitted for Right eye Vitreous Hemorrhage and severe hyperglycemia (initial glucose of 828 mg/dl; pH 7.36) approaching HHS with initial calculated serum osmolality of 306 mOsm/kg.   Given Lantus 25 units ( 12/20/22 at ~ 2341) SQ prior to having insulin  infusion started which was then DC (12/21/22 at ~ 0430) with symptomatic hypoglycemia (POC glucose 75mg /dl) in am (12/21/22 ~ 0745) even with IVF D5 / 0.45% NaCl running at 200cc/hr.   On 12/24/22, patient was placed back on insulin infusion as POC glucose persistently > than 300mg /dl. Patient had received a total of Lantus 25 units (12/24/22) prior to insulin infusion which was then DC on 12/25/22 at ~ 0400.    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 (0000000)  Diabetic complications: retinopathy and peripheral neuropathy   Followed by PCP  in [place] - last seen " a few months ago"  Last seen by Endo in [place] (patient cannot recall name) - Jan 2024    Interval History:   Glucose this am 217mg /dl    12/25/22: Insulin infusion stopped ~ 0400 as POC glucose ~99mg /dl; patient requested juice and was adamant that insulin infusion be stopped  12/24/22: insulin infusion started     Recent labs for the past 24 hours     12/24/22  1005 12/24/22  1158 12/24/22  1504 12/24/22  1709 12/24/22  1911 12/24/22  1917 12/24/22  2010 12/24/22  2053 12/24/22  2148 12/24/22  2348 12/25/22  0046 12/25/22  0142 12/25/22  0238 12/25/22  0336 12/25/22  0408 12/25/22  0433 12/25/22  0508 12/25/22  0516 12/25/22  0613 12/25/22  0720   PGLU 415* 313* 454* 488* 317*  --  362* 369* 347* 212* 208* 115* 159* 140* 99 104* 181*  --  217* 215*   GLU  --   --   --   --   --  362*  --   --   --   --   --   --   --   --   --   --   --  213*  --   --       Last 24 Hour Range: 99 - 488    Current Diet Order:   PO Diet    DIABETIC DIET - MALE          Current Inpatient Insulin Regimen:   Basal: IV Insulin Infusion  Nutritional: Aspart (Novolog) None  Correctional: IV Insulin Infusion  Total Daily Dose of Insulin (TDDI) in the last 24 hours: ~ 33 + (from insulin infusion ~ 32units) units    I have reviewed the Glycemic Management Summary and have noted the following blood glucose trends:       Subjective      Overall doing well - happy about the insulin infusion  OK with use of Tresiba and cost    Attempting to down load DexCom G7 mobile app for CGM use - phone not compatible    Stated felt Blood Sugar going down - 107mg /dl at ~ [place] DM Regimen: Diabetes Home Regimen:  On 12/21/22 reported taking:  - Lantus 20 units SQ BID (last  dose was on 12/20/22 in am prior to admission)  - Meal time insulin of 15 units TID [facility]     Per chart review  Office Visit 10/25/19:   Nancee Liter  20 units SQ qHS  - Humalog 15 units for Breakfast; 18 units for Lunch; 18 units for dinner;   - 12 units for HS snacks  - Plus a scale with meals     DC Summary from [facility] (11/2018):-  - Lantus 15 units SQ daily  - Meal time Insulin:  Breakfast -15 units  Lunch - 18 units  Dinner - 18 units  - And Snack coverage - 12 units  - Correctional scale of 3 units for every 50mg /dl above 150mg /dl     -POC Glucose Checks: 3-4x a day; usual readings in the 200s range  -Hypoglycemia events: at least once a week  --Tx of hypoglycemia is with food; juice.  --Pt has needed emergency assistance for hypoglycemia in the past: denie     Inpatient Medications   Scheduled:     Atorvastatin (LIPITOR) Tablet 10 mg, Daily Bedtime    Fluoxetine (PROZAC) Capsule/Tablet 40 mg, QAM    Insulin Aspart (NOVOLOG) Injection Pen 1-20 Units, TID w/ meals    Insulin Aspart (NOVOLOG) Injection Pen 12 Units, TID w/ meals    Insulin Glargine (LANTUS) 100 unit/mL Injection 20 Units, Q12H Insulin    LevoTHYROxine Tablet 75 mcg, QAM AC    Mycophenolate (MMF) (CELLCEPT) Capsule 750 mg, BID    Pantoprazole (PROTONIX) Delayed Release Tablet 40 mg, QAM AC    Polyethylene Glycol 3350 (MIRALAX) Oral Powder Packet 17 g, QAM    Tacrolimus (FK-506) (PROGRAF) Capsule 1 mg, QAM    Tacrolimus (FK-506) (PROGRAF) Capsule 2 mg, QPM (1800)    PRN:     Acetaminophen (TYLENOL) Tablet 650 mg, Q4H PRN    Bisacodyl (DULCOLAX) EC Tablet 10 mg, Q24H PRN    Camphor/Menthol (SARNA) Lotion, TID PRN    Capsaicin (ZOSTRIX) 0.025 % Cream, BID prn    Dextrose 10% 10-20 g IVPB 100-200 mL, PRN    Dextrose 10% 7.5-15 g IVPB 75-150 mL, PRN    Diphenhydramine-Zinc Acetate (BENADRYL ITCH-RELIEF) 1-0.1 % Cream, TID PRN    Glucagon Injection 1 mg, PRN    Glucose Chewable Tablet 12-24 g, PRN    Melatonin Tablet 3 mg, Bedtime PRN     Ondansetron (ZOFRAN) Injection 4 mg, Q8H PRN    Prochlorperazine (COMPAZINE) Injection 5-10 mg, Q6H PRN    Simethicone (GAS-X) Chewable Tablet 80 mg, Q6H PRN    Continuous:     [Held by provider] D5 / 0.45% NaCl Infusion, CONTINUOUS    Insulin Regular 100 Units in NaCl 0.9% 100 mL Continuous Infusion, CONTINUOUS       Allergies: Shellfish derived      Objective   Objective       Vitals:  Vital Signs have been reviewed   Most Recent:BP 108/67   Pulse 76   Temp 36.7 C (98.1 F) (Oral)   Resp 16   Ht 1.829 m (6')   Wt 73.9 kg (163 lb)   SpO2 95%   BMI 22.11 kg/m     Results: The following results have been reviewed:     Labs - Past 72 Hours     12/25/22  0516   CREATININE BLOOD 1.06   GLUCOSE 213*   UREA NITROGEN, BLOOD (BUN) 25*   SODIUM 136   POTASSIUM 3.6   CHLORIDE 99   CARBON DIOXIDE TOTAL 24   CALCIUM 9.1        Hgb A1C (%)   Date Value   12/21/2022 12.7       Intake and  Output Last Shift:  In: 600 [Oral:600]  Out: 1 [Stool:1]    Physical Exam  Vitals and nursing note reviewed.   Constitutional:       Appearance: Normal appearance.   HENT:      Head: Normocephalic and atraumatic.   Pulmonary:      Effort: Pulmonary effort is normal.   Musculoskeletal:         General: Normal range of motion.   Skin:     General: Skin is warm and dry.   Neurological:      Mental Status: He is alert and oriented to person, place, and time. Mental status is at baseline.               The above recommendations were given to Dr. Colbert Coyer of the ([facility] service to optimize the patient's glycemic regimen.  Patient was seen and case discussed with Endocrinology attending Dr. Elroy Channel and the above plan was formulated in part with them, who agreed with above recommendations.  Total time I spent in care of this patient today (excluding time spent on other billable services) was  46  minutes. This time does not overlap with other providers involved in this patient's care.     Alto Denver,  NP       Inpatient Glycemic Team  [employer] ([phone])"npatient Glycemic Team"  (After hours- Endo Fellow On Call)  I have personally performed a face to face diagnostic evaluation on this patient.  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 Inpatient Glycemic Team was consulted for glycemic management.    VSS and nursing notes reviewed.  General Appearance: No distress. Thin WM frustrated.  HEENT: Normocephalic. Poor visual acuity.      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  Extensive discussion with patient.  His phone was not compatible with Dexcom G7.  He wants to start process for insulin pump/CGM automated insulin delivery system (AID).  In meantime trying to get prodigy talking meter for the  patient.    Total time I spent in care of this patient today (excluding time spent on other billable services) was >35 minutes. This time does  not overlap with other providers involved in this patient's care.     Jolene Provost, MD  Associate Clinical Professor of Medicine  Director, Inpatient Glycemic Team  Division of Endocrinology, Diabetes and Metabolism  [employer]

## 2022-12-25 NOTE — Nurse Focus (Signed)
Secure chat message sent to Hospitalist Dr. Darrall Dears, regarding patient current blood sugar 99, Cont insulin stopped per patient request, A/Ox3, offered Juice and crackers. Per parameters Insulin drip to continue at 0.5 Units. Patient adamant the Insulin drip be stopped per patient request. Patient agreeable to resume Insulin drip when blood sugar is greater than 250.  MD ordered MISC to resume Insulin drip when patient blood sugar is greater than 250. Will cont to monitor.

## 2022-12-25 NOTE — Nurse Focus (Signed)
Now, pt verbalized "he fell around 1930 pm last night. Report nurse AL and [place], patient was seen lowering himself down to sit in the middle of the door frame blocking Nurses from entering the room during change of shift. Refused skin assessment. Verbalized "he is ok, just very emotional and saying that no one cares about him". MD notified. Pt A + O X4. Vital sign stable. No complaining any pain at this time. Per MD will do Neuro check when patient is agreeable.  Will cont to monitor.

## 2022-12-25 NOTE — Care Plan (Signed)
Problem: Adult Inpatient Plan of Care  Goal: Plan of Care Review  Outcome: Ongoing, Progressing  Flowsheets (Taken 12/25/2022 1655)  Plan of Care Reviewed With: patient  Progress: no change    A&Ox4. Vitals stable. No pain. Insulin gtt was stopped during night shift. Started on Lantus and Novolog in am. Random blood sugar check showed glucose level 60, treated for hypoglycemia with juice. 15 min recheck is 51, gave juice, milk, crackers, and  glucose tabs. Drew BMP. Repeat glucose 69->122->270. MD aware.      Pt has been asking to talk with upper management regarding his care on 12/24/22. Reached out to patient relations, stated they will see him today. Paged manager that is covering for E4 today, did not hear back. Charge RN, Benjamine Mola, emailed [employer].     Problem: Skin Injury Risk Increased  Goal: Skin Health and Integrity  Outcome: Ongoing, Progressing     Problem: Hyperglycemia  Goal: Blood Glucose Level Within Targeted Range  Outcome: Ongoing, Progressing

## 2022-12-25 NOTE — Progress Notes (Signed)
INTERNAL MEDICINE DAILY PROGRESS NOTE    Date: 12/25/2022 Time: 18:52    CHIEF COMPLAINT  Blurry vision     INTERVAL  -presented to ED w/ blury vision and inability to take insulin   - found to be in HHS.   - labile blood sugars; IGT consulted   - of note, sugars have dropped to 70s after being up in the 300s. Sugars very labile.     SUBJECTIVE  -pt upset. Per pt, felt dizzy last night and fell to ground. States he hit back of head on door. Did not pass out. States nurse evaluated him and no bumps or burising.   - no headache now. No other injuries or pain   - per nursing notes, pt was evaluated by nursing during this reported event and pt lowered himself to ground. Please see documentation   - currently pt otherwise feels improved after being on insulin drip. Sugars improved today.   - pt still w/ very labile BG, down to 51 around lunch time, back up to 200s this afternoon.     OBJECTIVE    Vitals Signs  Current Vitals  Temp: 36.5 C (97.7 F)  BP: 132/71  Pulse: 76  Resp: 18  SpO2: 99 %     Weight: 73.9 kg (163 lb)    Intake and Output  Last Two Completed Shifts  In: 600 [Oral:600]  Out: 1 [Stool:1]    Current Shift  In: 2630 [Oral:2610; Crystalloid:20]  Out: 1 [Stool:1]    PHYSICAL EXAM     Physical Exam  Constitutional:       Comments: Sitting up in bed. Conversing appropriately.    HENT:      Head: Normocephalic and atraumatic.   Eyes:      General: No scleral icterus.        Right eye: No discharge.         Left eye: No discharge.      Extraocular Movements: Extraocular movements intact.      Conjunctiva/sclera: Conjunctivae normal.   Cardiovascular:      Rate and Rhythm: Normal rate and regular rhythm.      Heart sounds: No murmur heard.     No friction rub. No gallop.   Pulmonary:      Effort: Pulmonary effort is normal. No respiratory distress.      Breath sounds: No stridor. No wheezing, rhonchi or rales.   Abdominal:      General: There is no distension.      Palpations: Abdomen is soft.       Tenderness: There is no abdominal tenderness. There is no guarding.   Musculoskeletal:      Right lower leg: No edema.      Left lower leg: No edema.   Skin:     General: Skin is warm and dry.      Coloration: Skin is not jaundiced.      Comments: No rash to exposed skin   Neurological:      Mental Status: He is alert.      Comments: Moving all extremities spontaneously. Alert and oriented to self and situation.          Labs and Studies  I have reviewed the following information from the last 24 hours: allied health and treating physician notes, imaging, and labs and microbiology data          Lab Results - 48 hours (excluding micro and POC)   POC GLUCOSE PRN  Status: Abnormal   Result Value Status    POC GLUCOSE 282 (H) Final   CBC No Differential     Status: Normal   Result Value Status    White Blood Cell Count 6.7 Final    Red Blood Cell Count 5.21 Final    Hemoglobin 14.9 Final    Hematocrit 44.2 Final    MCV 84.9 Final    MCH 28.7 Final    MCHC 33.8 Final    RDW 14.2 Final    MPV 8.7 Final    Platelet Count 192 Final   Magnesium (Mg)     Status: Normal   Result Value Status    Magnesium (Mg) 1.8 Final   POC GLUCOSE PRN     Status: Abnormal   Result Value Status    POC GLUCOSE 400 (H) Final   Tacrolimus Trough (FK506)     Status: Abnormal   Result Value Status    Tacrolimus (FK506) 3.4 (L) Final   POC GLUCOSE PRN     Status: Abnormal   Result Value Status    POC GLUCOSE 415 (H) Final   POC GLUCOSE PRN     Status: Abnormal   Result Value Status    POC GLUCOSE 313 (H) Final   POC GLUCOSE PRN     Status: Abnormal   Result Value Status    POC GLUCOSE 454 (H) Final   POC GLUCOSE PRN     Status: Abnormal   Result Value Status    POC GLUCOSE 488 (H) Final   POC GLUCOSE Q1H     Status: Abnormal   Result Value Status    POC GLUCOSE 317 (H) Final   Basic Metabolic Panel     Status: Abnormal   Result Value Status    Sodium 134 (L) Final    Potassium 4.0 Final    Chloride 98 Final    Carbon Dioxide Total 23 Final     Anion Gap 13 Final    Urea Nitrogen, Blood (BUN) 27 (H) Final    Creatinine Serum 1.21 (H) Final    Glucose 362 (H) Final    Calcium 9.8 Final    E-GFR Creatinine (Male) 66 Final   Beta-Hydroxybutyrate     Status: Normal   Result Value Status    Beta-Hydroxybutyrate 0.12 Final   POC GLUCOSE Q1H     Status: Abnormal   Result Value Status    POC GLUCOSE 362 (H) Final   POC GLUCOSE PRN     Status: Abnormal   Result Value Status    POC GLUCOSE 369 (H) Final   POC GLUCOSE Q1H     Status: Abnormal   Result Value Status    POC GLUCOSE 347 (H) Final   POC GLUCOSE PRN     Status: Abnormal   Result Value Status    POC GLUCOSE 212 (H) Final   POC GLUCOSE Q1H     Status: Abnormal   Result Value Status    POC GLUCOSE 208 (H) Final   POC GLUCOSE Q1H     Status: Abnormal   Result Value Status    POC GLUCOSE 115 (H) Final   POC GLUCOSE Q1H     Status: Abnormal   Result Value Status    POC GLUCOSE 159 (H) Final   POC GLUCOSE Q1H     Status: Abnormal   Result Value Status    POC GLUCOSE 140 (H) Final   POC GLUCOSE Q1H     Status:  None   Result Value Status    POC GLUCOSE 99 Final   POC GLUCOSE PRN     Status: Abnormal   Result Value Status    POC GLUCOSE 104 (H) Final   POC GLUCOSE QAM     Status: Abnormal   Result Value Status    POC GLUCOSE 181 (H) Final   Magnesium (Mg)     Status: Normal   Result Value Status    Magnesium (Mg) 1.8 Final   Basic Metabolic Panel     Status: Abnormal   Result Value Status    Sodium 136 Final    Potassium 3.6 Final    Chloride 99 Final    Carbon Dioxide Total 24 Final    Anion Gap 13 Final    Urea Nitrogen, Blood (BUN) 25 (H) Final    Creatinine Serum 1.06 Final    Glucose 213 (H) Final    Calcium 9.1 Final    E-GFR Creatinine (Male) 81 Final   Tacrolimus Trough (FK506)     Status: Abnormal   Result Value Status    Tacrolimus (FK506) 3.6 (L) Final   POC GLUCOSE Q1H     Status: Abnormal   Result Value Status    POC GLUCOSE 217 (H) Final   POC GLUCOSE Q1H     Status: Abnormal   Result Value Status     POC GLUCOSE 215 (H) Final   POC GLUCOSE PRN     Status: Abnormal   Result Value Status    POC GLUCOSE 204 (H) Final   POC GLUCOSE PRN     Status: Abnormal   Result Value Status    POC GLUCOSE 107 (H) Final   POC GLUCOSE PRN     Status: Abnormal   Result Value Status    POC GLUCOSE 129 (H) Final   POC GLUCOSE PRN     Status: Abnormal   Result Value Status    POC GLUCOSE 60 (L) Final   POC GLUCOSE PRN     Status: Abnormal   Result Value Status    POC GLUCOSE 51 (Crtl) Final   POC GLUCOSE PRN     Status: Abnormal   Result Value Status    POC GLUCOSE 69 (L) Final   Basic Metabolic Panel     Status: Abnormal   Result Value Status    Sodium 139 Final    Potassium 3.6 Final    Chloride 101 Final    Carbon Dioxide Total 24 Final    Anion Gap 14 Final    Urea Nitrogen, Blood (BUN) 23 (H) Final    Creatinine Serum 1.10 Final    Glucose 74 Final    Calcium 9.7 Final    E-GFR Creatinine (Male) 78 Final   POC GLUCOSE PRN     Status: Abnormal   Result Value Status    POC GLUCOSE 122 (H) Final   POC GLUCOSE PRN     Status: Abnormal   Result Value Status    POC GLUCOSE 270 (H) Final   POC GLUCOSE PRN     Status: Abnormal   Result Value Status    POC GLUCOSE 277 (H) Final     IMPRESSION:  1. Transplant renal artery stenosis screening risk category: Low. See  management recommendations below.    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 Work up in ED showed HHS. Not DKA. 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   > Upon arrival to the ED, patients glucose level was >800mg /dl. Patient has been unable to self administer insulin due to vision changes.No evidence of DKA. S/p insulin gtt, lantus, IVF. Patient is currently off  insulin gtt at time of admission with POC glucose ranging between 70-110mg /dl. 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    > hgb A1c 12.7  > on insulin gtt night of 3/26-3/27. Now improved glucose.   - IGT consulted   - Continue with Basal Insulin Glargine (Lantus) 15  units SQ qBID   - Continue with Nutritional Aspart (Novolog) 10 units TID AC (holding now hypoglycemic)   - change to sensitive Correctional Aspart (Novolog) Scale SQ w/ meals  - Carb controlled diet  - hypoglycemia protocol   - working on getting talking glucometer for DC.      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 He describes it as a red fog; he is able to see it through it but his vision is now much more limited. He also sees some spots. Denies flashes, dark curtain/shadow.   -Ophthalmology consulted-- - Dilated exam showed diffusely hazy view  - B-scan ultrasound showed vitreous hemorrhage without retinal detachment   - Most likely etiology is diabetic retinopathy in the setting of uncontrolled diabetes  - Recommend minimal activity, sleep with head of bed elevated, avoid non medically necessary blood thinners and NSAIDs  - Return precautions discussed and emergency line reiterated  - Upon discharge, patient should follow-up with the Retina Service in 1 week-referral placed   - Recommend lensometer reading of spectacles at that time to determine degree of myopia. Once vit heme has resolved should establish care with our retina service     AKI. Resolved.   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 Likely secondary to HHS. Unknown baseline. Current sCr 0.92. Of note,  patient is s/p kidney transplant in 2011.   > tacrolimus level 3/23 < 2.0. now 4.3. suspect possible non compliance.   > transplant kidney US w/ low risk of RAS  -Monitor daily BMP  -glucose correction as noted as above   - increase tacro to 2mg  BID as tacro low   - con thome cellcept 750 BID  - follow-up tacrolimus level tomorrow   - tranplant nephro consulted. - cont tacro as above.   - will discuss prednisone w/ transplant team as pt states he is on that.                 HLD  Continue atorvastatin      Hypothyroidism  > TSH within normal limits   Continue levothyroxine     HTN  Continue PTA lisinopril     FEN: DIABETIC DIET - MALE  SPECIAL TRAY REQUEST  SPECIAL TRAY REQUEST  SPECIAL TRAY REQUEST  SNACKS  DVT Prophylaxis: SCD  in setting of vitreous hemorrhage.   Advanced Care Planning/Goals of Care: Full code   Disposition: pending PT/OT eval    Updated sister Joelene Millin in phone 12/25/22    Total time I spent in care of this patient today (excluding time spent on other billable services) was 55 minutes. This time does not overlap with other providers involved in this patient's care.       Report Electronically Signed by: Mayford Knife, [facility]

## 2022-12-25 NOTE — Care Plan (Signed)
Problem: Adult Inpatient Plan of Care  Goal: Plan of Care Review  Outcome: Ongoing, Progressing  Flowsheets (Taken 12/25/2022 0327)  Plan of Care Reviewed With: patient  Progress: no change  Outcome Evaluation: Pt mostly awake at night. VSS. A + O X 4, uncooperative and labile. RI drip started at 2000. FS q 1 hrs. Pt reported, he fell, but report nurse, pt was seen  lowering himself down to sit in the middle of door frame. Refused skin assessment. Pt denied injury and pain. MD notified. Will cont to monitor.

## 2022-12-25 NOTE — Nurse Focus (Signed)
Assume care for 30 minutes lunch break,patient asleep. Will monitor

## 2022-12-25 NOTE — Nurse Assessment (Signed)
ASSESSMENT NOTE    Note Started: 12/25/2022, [id number]     Assumed care. Pt resting in bed. A&Ox4. Initial assessment completed and recorded in EMR.  Report received from night shift nurse and orders reviewed. Plan of Care reviewed and appropriate, discussed with patient.  Stephannie Peters, RN

## 2022-12-26 ENCOUNTER — Other Ambulatory Visit: Payer: Self-pay

## 2022-12-26 LAB — BASIC METABOLIC PANEL
Anion Gap: 12 mmol/L (ref 7–15)
Calcium: 9.1 mg/dL (ref 8.6–10.0)
Carbon Dioxide Total: 25 mmol/L (ref 22–29)
Chloride: 99 mmol/L (ref 98–107)
Creatinine Serum: 1.01 mg/dL (ref 0.51–1.17)
E-GFR Creatinine (Male): 86 mL/min/{1.73_m2}
Glucose: 276 mg/dL — ABNORMAL HIGH (ref 74–109)
Potassium: 4 mmol/L (ref 3.4–5.1)
Sodium: 136 mmol/L (ref 136–145)
Urea Nitrogen, Blood (BUN): 24 mg/dL — ABNORMAL HIGH (ref 6–20)

## 2022-12-26 LAB — MAGNESIUM (MG): Magnesium (Mg): 1.7 mg/dL (ref 1.6–2.4)

## 2022-12-26 LAB — POC GLUCOSE
POC GLUCOSE: 294 mg/dL — ABNORMAL HIGH (ref 70–99)
POC GLUCOSE: 202 mg/dL — ABNORMAL HIGH (ref 70–99)
POC GLUCOSE: 88 mg/dL (ref 70–99)
POC GLUCOSE: 118 mg/dL — ABNORMAL HIGH (ref 70–99)
POC GLUCOSE: 82 mg/dL (ref 70–99)
POC GLUCOSE: 147 mg/dL — ABNORMAL HIGH (ref 70–99)

## 2022-12-26 LAB — TACROLIMUS TROUGH (FK506): Tacrolimus (FK506): 7.4 ng/mL (ref 5.0–15.0)

## 2022-12-26 MED ORDER — LANCING DEVICE
1.0000 | Freq: Every day | 0 refills | Status: AC | PRN
Start: 2022-12-26 — End: 2023-12-21

## 2022-12-26 MED ORDER — FREESTYLE LIBRE 3 READER
1.0000 | Freq: Four times a day (QID) | 0 refills | Status: AC
Start: 2022-12-26 — End: 2023-01-25

## 2022-12-26 MED ORDER — FORA V10-V12-D10-D20 STRIPS-LANCETS 30 GAUGE COMBO PACK
420.0000 | PACK | Freq: Every day | 0 refills | Status: AC | PRN
Start: 2022-12-26 — End: 2023-12-21

## 2022-12-26 MED ORDER — BLOOD-GLUCOSE METER
1.0000 | Freq: Once | 0 refills | Status: AC
Start: 2022-12-26 — End: 2023-12-21

## 2022-12-26 MED ORDER — FREESTYLE LIBRE 3 READER
1.0000 | Freq: Four times a day (QID) | 0 refills | Status: AC
Start: 2022-12-26 — End: 2023-01-25
  Filled 2022-12-26: qty 1, 30d supply, fill #0

## 2022-12-26 MED ORDER — FORA V10 STRIPS
ORAL_STRIP | 11 refills | Status: AC
Start: 2022-12-26 — End: 2023-12-21

## 2022-12-26 NOTE — Care Plan (Signed)
Ophthalmology Care Plan    Given that the patient is pending discharge within the next few days, patient should follow up with the Retina service early next week for ophthalmic imaging and evaluation by the Retina team. Staff messaged clinic schedulers and instructed the team to reach patient via patient's sister. If any questions about scheduling, patient and family can call the eye center at [phone].    Valerie Salts, MD  PGY-2 Ophthalmology  Available via Tiger[employer]  Pager: [phone]

## 2022-12-26 NOTE — Allied Health Progress (Addendum)
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  Authorizing Physician (First and Last Name) and PI#: Personius, Malva Cogan, MD  Primary Service: (A) Hospital Medicine Faculty Service  Language: [place]  Personal Protective Equipment Utilized: Gloves and Patient wore a mask during this encounter: no -    Precautions: No Known Precautions/Restrictions  Isolation Precautions: Not applicable    Total Eval Minutes: 10  Total Treatment Minutes: 8  ----------------------------------------------------------------------------------------------------------------------------------------------------------------------------  CURRENT Occupational Therapy Discharge Recommendations:        Disposition: B & C vs. Home        Has the patient met minimum prerequisites for recommended disposition?  YES.   Recommended follow up therapy after discharge:  Home Health OT, RN and PT at B &C destination   Referral Recommendations (for follow up):  Clinic Referral: Blind rehab   and IHSS set up                  Equipment Recommendations:  N/A Equipment Size: N/A     ----------------------------------------------------------------------------------------------------------------------------------------------------------------------------    History of Present Illness/Injury (Including pertinent test results & procedures):   Copied from EMR:  57 year old male with Hx of Left eye vision loss,  Type 1 diabetes uncontrolled (HgbA1c 12.7% on 11/2022; previous A1c per chart review of 11% in 11/2018) admitted for Right eye Vitreous Hemorrhage who had insulin infusion restarted (12/24/22) for persistent hyperglycemia. Of note, patient had already received a total Lantus dose of 25 untis (on 12/24/22) and transitioned off with Lantus 20 untis q12 and nutritional novolog of 12 units tid ac with an average correctional scale.   Hypoglycemia with a POC glucose of 51 mg/dl noted with changes made to basal/bolus insulin doses as above.  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  Currently the Patient's nutrition Rx is: 60-75 gms carb/meal (recommended for men)       Current Diet Order:   PO Diet     DIABETIC DIET - MALE   See above recommendations.     If NPO:  - decrease current basal insulin dose by 20%  - hold scheduled Nutritional Novolog   - change current Correctional scale to q 4hours      DC PLanning:  Patient will  likely benefit from Degludec Tyler Aas) an ultralong acting basal insulin with duration effect of ~42 hours and a half life of ~ 25 hours ( instead of using Lantus q12 hours). Patient made aware and agreeable to cost of Tresiba ~ $11/month.  Per Pharmacy (12/26/22), Haena 3 Reader Not covered (likely needs PA) and not in stock in [employer] ([place]); the same for [place] 2.     Discharge Recommendations:  - Tresiba (Degludec) pen ($11 /mo) 30 units SQ QHS  - Aspart (Novolog) pen Or Covered Alternative 10 units SQ TID AC  Plus Correctional scale with Novolog 1 unit for every  50mg /dl above 150mg /dl tid ac  - Inhaled Glucagon (Baqsimi) PRN hypoglycemia  Establish and Follow up with PCP for :  - further diabetes medication dose adjustments  - referral to Endocrinology (placed for you - patient is interested in continuous glucose monitoring = CGM sensor and insulin pump) - was previously on an insulin pump and CGM (last time ~ 2012) prior to pancreas transplant  Summary:   57 year old male with Hx of Left eye vision loss, Type 1 diabetes uncontrolled (HgbA1c pending, previous A1c per chart review of 11% in 11/2018) admitted for Right eye Vitreous Hemorrhage and severe hyperglycemia (initial glucose of 828 mg/dl; pH 7.36) approaching HHS with initial calculated serum osmolality of 306 mOsm/kg.   Given Lantus 25 units ( 12/20/22 at ~ 2341) SQ prior to having insulin infusion started which was then DC (12/21/22 at ~ 0430) with symptomatic hypoglycemia (POC glucose 75mg /dl) in am (12/21/22 ~ 0745) even with IVF D5 / 0.45% NaCl running at 200cc/hr.   On 12/24/22, patient was placed back on insulin infusion as POC glucose persistently > than 300mg /dl. Patient had received a total of Lantus 25 units (12/24/22) prior to insulin infusion which was then DC on 12/25/22 at ~ 0400.     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    Past Medical History:   Past Medical History:   Diagnosis Date    DM2  (diabetes mellitus, type 2)     HTN (hypertension)     Hyperlipemia     Hypothyroidism         Past Surgical History:   No past surgical history on file.     Hand Dominance: Right    Objective, Assessment and Plan:     12/26/22 1113   Time and Intention   Document Type evaluation   Mode of Treatment occupational therapy;individual therapy   Total Minutes, Occupational Therapy 20  (11:18)   Pain   Comment, Pain pt denied pain   General Information   Prior Level of Function  Ambulating independently;Independent with BADL's;Need assistance with IADL's  (reports his sister helps w. grocery shopping)   Pertinent History of Current Functional Problem 57yr old male, with history of HTN, HLD, DM2, Hypothyroidism, 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 Work up in ED showed HHS. Not DKA. He was seen by Ophthalmology and confirmed patient has vitreous hemorrhage to R  eye   Existing Precautions/Restrictions no known precautions/restrictions   Isolation Precautions protective environment maintained   History of Falls in the last 6 months no   Limitations/Impairments visual   Subjective   Subjective RN cleared pt for OT.   Occupational Profile   Patient Goals (Occupational Profile) expressed needs some help at home from R eye new difficulties w. 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 pt claimed he had made adaptations to it just needs  some repittion sometime from loud environment on extra time for clarity on processing sound.  (pt educated on possible challenges when in loud environment to compensate for visual perception w/ sound for environmental safety reactive/protective response maybe be reduced.)   Range of Motion (ROM)   Comment, ROM WFL B UE/LE   Edema   Comment, Edema none   Manual Muscle Testing   Comment, Manual Muscle Testing WFL B UE/LE   Cognition   Cognitive Function WFL   Comment, Cognition able to indicate his safety and care needs.   Bed Mobility   Bed Mobility bed mobility (all) activities   All Activities, Independence (Bed Mobility) independent   Functional Mobility   Comment, (Transfers) mod I for self care mob. w. safety concern w. unfamiliar environment d.t his recent worsened vision to R eye   Safety Issues, Functional Mobility   Impairments Affecting Function (Mobility) visual/perceptual   Comment, Safety Issues/Impairments (Mobility) discussed learning adapted tech/ training from blind rehab, discussed support needs may need to be navigated/set up before returning home. Pt stated his sister his an hour drive away and only his support- been doing his grocery shopping. Pt expressed concerns that he'd like not to burden her sister as she has a full time job.   Therapy Assessment/Plan (OT)   Current Functional Level Independent   Therapy Frequency (OT) evaluation only   Predicted Duration of Therapy Intervention (OT)   (and one time tx)   Problem List (OT) vision;mobility  (for IADLs at home and in the community, not limited to medication mgmt, community self mgmt safety.)   Comment, Therapy Assessment/Plan (OT) Pt Ind w. ADLs but with safety concerns w/ iADL for home and in the community with very limited support from sister, with his R eye recent vision changes.  (Recommend SW to assist and navigate pt w/  community resources, support group for blind rehab and maybe IHSS being initiated before pt will dc. recommend HHS to assist pt w/ home self mgmt teaching at home environment and to safe return to community.)   OT Goals   Problem Specific Goal Selection (OT) problem specific goal 1, OT   Problem Specific Goal 1 (OT)   Strategies/Barriers (Problem Specific Goal 1, OT) education on supports needs   Progress/Outcome (Problem Specific Goal 1, OT) goal met   Problem Specific Goal 1 (OT) safe IADLs at home and community re-entry safety.   Time Frame (  Problem Specific Goal 1, OT) by discharge   Evaluation Complexity   Overall Complexity of Evaluation (OT) low complexity   Therapy Plan Review/Discharge Plan (OT)   Anticipated Discharge Disposition (OT) home with home health   Demonstrates Need for Referral to Another Service (OTl) social work;home health care   Discharge Summary (OT)   Reason for Discharge (OT) no further needs identified   Outcomes Achieved Upon Discharge (OT)   (anticipated dc today)   Discharge Summary Statement (OT) safe to dc home for self care, recommended HHS RN for support w. proper BG and medicine management. [place] OT to follow at home for home safe mob teaching given R eye vision changes, recommend assist for setting up IHSS for IADLs for home and safe community re-entry, Out Pt blind rehab.   Discharge Milestones   No OT Barriers to Discharge True         Patient / Caregiver Education Today: Yes, educated on See plan mentioned above, Reviewed OT role, POC, and recommendations, Education complete.              Method of Teaching: verbal             Learner: patient            Response: verbalizes understanding    Recommended Frequency of Treatments: Evaluation and single treatment only  Duration of Treatment: No further treatment necessary, DC OT      Additional Treatment Rendered:    S: RN cleared pt for OT treatment session. Pt agreeable.    O:  self mgmt, care needs for home validated,  recommendation for home and environmental safety assessments discussed with pt.  > recommended use of electric razor vs with sharp regular razor with blades.    A: Pt with good understanding and carry over for teaching/retraining provided. Pt awaiting his talking glucometer being ordered by MD.  12:50  Addendum: after discussion with PT.  I concur to team's plan for pt for B & C with HSS RN, PT, OT will be to pt's best interest for now, until IHSS set up and Blind rehab.    P: See plan mentioned above- DC from acute care OT.    Report Electronically Signed By:  Edmonia James. Terisa Starr, OT/L   Occupational Therapist II  [phone]  Dept. Of Physical Medicine & Rehabilitation / Acute Care Services  Pager Number / OT Scheduling [phone]  [place] x [phone]

## 2022-12-26 NOTE — Care Plan (Signed)
Problem: Adult Inpatient Plan of Care  Goal: Plan of Care Review  Outcome: Ongoing, Progressing  Flowsheets (Taken 12/26/2022 0301)  Plan of Care Reviewed With: patient  Progress: no change  Outcome Evaluation: Pt asleep well and easily awake. VSS. No s/s of distress. No other issues noted. Will cont to monitor.

## 2022-12-26 NOTE — Nurse Focus (Addendum)
Pt upset and refusing to eat dinner because he is getting a safety tray (paper tray). 10 units of Novolog was already given. I informed pt that paper tray was ordered because pt has made some verbal threats on 12/24/22 towards staff. Pt states "that nurse was coming towards me," and "I want to talk with Darrell." Informed pt that Marguerite Olea is out of the office, pt states "I want to talk with someone above him or someone who made the change. I'm not going to eat until I get a regular tray. My blood sugar is going to drop and them its going to be your problem."     Charge Rn Thurmann attempted to talk with pt. Pt refusing to talk with him. Best team notified. MD aware.       1825: Pt still taking with best team, agreeable to let Rn check glucose level =80. Pt agreeable to take glucose tabs and or juice. Gave 2 juices.     1835: Pt agreeable to eat. Tray warmed up. Continue to monitor glucose checks.

## 2022-12-26 NOTE — Care Plan (Signed)
Problem: Adult Inpatient Plan of Care  Goal: Plan of Care Review  Outcome: Ongoing, Progressing  Flowsheets (Taken 12/26/2022 1639)  Plan of Care Reviewed With: patient  Progress: no change   A&Ox4. Vitals stable. No pain. Sleeping between care. Up to bathroom independently. Worked with PT/OT. Eating well. Glucose 88-294, cover with Novolog per orders.      Problem: Skin Injury Risk Increased  Goal: Skin Health and Integrity  Outcome: Ongoing, Progressing     Problem: Hyperglycemia  Goal: Blood Glucose Level Within Targeted Range  Outcome: Ongoing, Progressing     Problem: Fall Injury Risk  Goal: Absence of Fall and Fall-Related Injury  Outcome: Ongoing, Progressing

## 2022-12-26 NOTE — Progress Notes (Signed)
Inpatient Glycemic Team Progress Note  Consulting Service:  Endocrinology  HD: 4     Name:Jondavid Kwanza Briski  MRN: [id number]  DOB: December 15, 1965    Note Started: 12/26/2022 07:04 Date of Service: 12/26/2022   Date of Admission: 12/20/2022 Current Attending: Renee Ramus Mich*        Recommendation:  - Continue with Basal Insulin Glargine (Lantus)  15 units SQ q 12 hours  - Continue with Nutritional Aspart (Novolog)  10 units TID AC  - Continue with Snack Coverage Novolog 2 units SQ TID ( min of 30 grams of carbs/snack)  - Continue with Sensitive Correctional Aspart (Novolog) Scale SQ TID AC     Discharge Recommendations:  - Tresiba (Degludec) pen ($11 /mo) 30 units SQ QHS  - Aspart (Novolog) pen Or Covered Alternative 10 units SQ TID AC  Plus Correctional scale with Novolog 1 unit for every 50mg /dl above 150mg /dl tid ac  - Inhaled Glucagon (Baqsimi) PRN hypoglycemia  Establish and Follow up with PCP for :  - further diabetes medication dose adjustments  - referral to Endocrinology (placed for you - patient is interested in continuous glucose monitoring = CGM sensor and insulin pump) - was previously on an insulin pump and CGM (last time ~ 2012) prior to pancreas transplant  Assessment/Plan     57 year old male with Hx of Left eye vision loss, Type 1 diabetes uncontrolled (HgbA1c 12.7% on 11/2022; previous A1c per chart review of 11% in 11/2018) admitted for Right eye Vitreous Hemorrhage who had insulin infusion restarted (12/24/22) for persistent hyperglycemia. Of note, patient had already received a total Lantus dose of 25 untis (on 12/24/22) and transitioned off with Lantus 20 untis q12 and nutritional novolog of 12 units tid ac with an average correctional scale.   Hypoglycemia with a POC glucose of 51 mg/dl noted with changes made to basal/bolus insulin doses as above.  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  Currently the Patient's nutrition Rx is: 60-75 gms carb/meal (recommended for men)   Current Diet Order:   PO Diet    DIABETIC DIET - MALE   See above recommendations.    If NPO:  - decrease current basal insulin dose by 20%  - hold scheduled Nutritional Novolog   - change current Correctional scale to q 4hours     DC PLanning:  Patient will  likely benefit from Degludec Tyler Aas) an ultralong acting basal insulin with duration effect of ~42 hours and a half life of ~ 25 hours ( instead of using Lantus q12 hours). Patient made aware and agreeable to cost of Tresiba ~ $11/month.  Per Pharmacy (12/26/22), [place] 3 Reader Not covered (likely needs PA) and not in stock in [employer] ([place]); the same for [place] 2.    Discharge Recommendations:  - Tresiba (Degludec) pen ($11 /mo) 30 units SQ QHS  - Aspart (Novolog) pen Or Covered Alternative 10 units SQ TID AC  Plus Correctional scale with Novolog 1 unit for every 50mg /dl above 150mg /dl tid ac  -  Inhaled Glucagon (Baqsimi) PRN hypoglycemia  Establish and Follow up with PCP for :  - further diabetes medication dose adjustments  - referral to Endocrinology (placed for you - patient is interested in continuous glucose monitoring = CGM sensor and insulin pump) - was previously on an insulin pump and CGM (last time ~ 2012) prior to pancreas transplant  Summary:   57 year old male with Hx of Left eye vision loss, Type 1 diabetes uncontrolled (HgbA1c pending, previous A1c per chart review of 11% in 11/2018) admitted for Right eye Vitreous Hemorrhage and severe hyperglycemia (initial glucose of 828 mg/dl; pH 7.36) approaching HHS with initial calculated  serum osmolality of 306 mOsm/kg.   Given Lantus 25 units ( 12/20/22 at ~ 2341) SQ prior to having insulin infusion started which was then DC (12/21/22 at ~ 0430) with symptomatic hypoglycemia (POC glucose 75mg /dl) in am (12/21/22 ~ 0745) even with IVF D5 / 0.45% NaCl running at 200cc/hr.   On 12/24/22, patient was placed back on insulin infusion as POC glucose persistently > than 300mg /dl. Patient had received a total of Lantus 25 units (12/24/22) prior to insulin infusion which was then DC on 12/25/22 at ~ 0400.     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 (0000000)  Diabetic complications: retinopathy and peripheral neuropathy   Followed by PCP  in [place] - last seen " a few months ago"  Last seen by Endo in [place] (patient cannot recall name) - Jan 2024    Interval History:   Glucose this am 294mg /dl     12/25/22: Had a hypo to a POC glucose of ~ 51mg /dl  12/25/22: Insulin infusion stopped ~ 0400 as POC glucose ~99mg /dl; patient requested juice and was adamant that insulin infusion be stopped  12/24/22: insulin infusion started     Recent labs for the past 24 hours     12/25/22  0720 12/25/22  0821 12/25/22  1103 12/25/22  1229 12/25/22  1345 12/25/22  1405 12/25/22  1422 12/25/22  1424 12/25/22  1441 12/25/22  1539 12/25/22  1707 12/25/22  2025   PGLU 215* 204* 107* 129* 60* 51* 69*  --  122* 270* 277* 256*   GLU  --   --   --   --   --   --   --  74  --   --   --   --       Last 24 Hour Range: 51 - 277    Current Diet Order:   PO Diet    DIABETIC DIET - MALE          Current Inpatient Insulin Regimen:   Basal: Glargine (Lantus)  15 units SQ q 12  hours  Nutritional: Aspart (Novolog)  10 units TID AC  Correctional: Sensitive Correctional Aspart (Novolog) Scale SQ TID AC  Total Daily Dose of Insulin (TDDI) in the last 24 hours: ~ 75 units    I have reviewed the Glycemic Management Summary and have noted the following blood glucose trends:       Subjective      Doing much better  Got to  speak with Nursing supervisor  Home DM Regimen: Diabetes Home Regimen:  On 12/21/22 reported taking:  - Lantus 20 units SQ BID (last dose was on 12/20/22 in am prior to admission)  - Meal time insulin of 15 units TID [facility]     Per chart review  Office Visit 10/25/19:   - Basaglar 20 units SQ qHS  - Humalog 15 units for Breakfast; 18 units for Lunch; 18 units for dinner;   - 12 units for HS snacks  - Plus a scale with meals     DC Summary from [facility] (11/2018):-  - Lantus 15 units SQ daily  - Meal time Insulin:  Breakfast -15 units  Lunch - 18 units  Dinner - 18 units  - And Snack coverage - 12 units  - Correctional scale of 3 units for every 50mg /dl above 150mg /dl     -POC Glucose Checks: 3-4x a day; usual readings in the 200s range  -Hypoglycemia events: at least once a week  --Tx of hypoglycemia is with food; juice.  --Pt has needed emergency assistance for hypoglycemia in the past: denied   Inpatient Medications   Scheduled:     Atorvastatin (LIPITOR) Tablet 10 mg, Daily Bedtime    Fluoxetine (PROZAC) Capsule/Tablet 40 mg, QAM    Insulin Aspart (NOVOLOG) Injection Pen 1-20 Units, TID w/ meals    Insulin Aspart (NOVOLOG) Injection Pen 10 Units, TID w/ meals    Insulin Aspart (NOVOLOG) Injection Pen 2 Units, With Snacks    Insulin Glargine (LANTUS) 100 unit/mL Injection 15 Units, Q12H Insulin    LevoTHYROxine Tablet 75 mcg, QAM AC    Mycophenolate (MMF) (CELLCEPT) Capsule 750 mg, BID    Pantoprazole (PROTONIX) Delayed Release Tablet 40 mg, QAM AC    Polyethylene Glycol 3350 (MIRALAX) Oral Powder Packet 17 g, QAM    Tacrolimus (FK-506) (PROGRAF) Capsule 2 mg, BID  (06,18)    PRN:     Acetaminophen (TYLENOL) Tablet 650 mg, Q4H PRN    Bisacodyl (DULCOLAX) EC Tablet 10 mg, Q24H PRN    Camphor/Menthol (SARNA) Lotion, TID PRN    Capsaicin (ZOSTRIX) 0.025 % Cream, BID prn    Dextrose 10% 10-20 g IVPB 100-200 mL, PRN    Diphenhydramine-Zinc Acetate (BENADRYL ITCH-RELIEF) 1-0.1 % Cream, TID PRN    Glucagon Injection 1 mg, PRN    Glucose Chewable Tablet 12-24 g, PRN    Melatonin Tablet 3 mg, Bedtime PRN    Ondansetron (ZOFRAN) Injection 4 mg, Q8H PRN    Prochlorperazine (COMPAZINE) Injection 5-10 mg, Q6H PRN    Simethicone (GAS-X) Chewable Tablet 80 mg, Q6H PRN    Continuous:     [Held by provider] D5 / 0.45% NaCl Infusion, CONTINUOUS       Allergies: Shellfish derived      Objective   Objective       Vitals:  Vital Signs have been reviewed   Most Recent:BP 114/72   Pulse 75   Temp 36.6 C (97.8 F) (Axillary)   Resp 16   Ht 1.829 m (6')   Wt 73.9 kg (163 lb)   SpO2 98%   BMI 22.11 kg/m     Results: The following results have been reviewed:     Labs - Past 72 Hours     12/25/22  0942 12/25/22  1424   HGB A1C 12.6*  --    CREATININE BLOOD  --  1.10   GLUCOSE  --  74   UREA NITROGEN, BLOOD (BUN)  --  23*   SODIUM  --  139   POTASSIUM  --  3.6   CHLORIDE  --  101   CARBON DIOXIDE TOTAL  --  24   CALCIUM  --  9.7        Hgb A1C (%)   Date Value   12/25/2022 12.6       Intake and Output Last Shift:  In: 3080 [Oral:3060; Crystalloid:20]  Out: 1 [Stool:1]    Physical Exam  Vitals and nursing note reviewed.   Constitutional:       Appearance: Normal appearance.   HENT:      Head: Normocephalic and atraumatic.   Pulmonary:      Effort: Pulmonary effort is normal.   Musculoskeletal:         General: Normal range of motion.   Neurological:      Mental Status: He is alert and oriented to person, place, and time. Mental status is at baseline.               The above recommendations were given to Dr. Colbert Coyer of the (A) [place] service to optimize the patient's  glycemic regimen.  Patient was seen and case discussed with Endocrinology attending Dr. Elroy Channel and the above plan was formulated in part with them, who agreed with above recommendations.  Total time I spent in care of this patient today (excluding time spent on other billable services) was  39  minutes. This time does not overlap with other providers involved in this patient's care.     Alto Denver, NP       Inpatient Glycemic Team  [employer] ([phone])"Inpatient Glycemic Team"  (After hours- Endo Fellow On Call)

## 2022-12-26 NOTE — Allied Health Consult (Signed)
[facility] -- ACUTE CARE SERVICE    PHYSICAL THERAPY EVALUATION      Name: Kerry Mercado   MRN: [id number]   Date of Service: 12/26/2022  Hospital Unit: E4  Time In: 0918    Time for Eval: 20 minutes    Physical Therapy Recommendations for Nursing: Patient is Independent and should be encouraged to mobilize out of bed and walk frequently every day.       CURRENT Physical Therapy Discharge Recommendations:        Disposition: Other: Board & Care (B&C) vs Home with Assist        Has the patient met minimum therapy prerequisites for recommended disposition? YES.    Recommended follow up therapy after discharge:    Outpatient PT  Referral Recommendations (for follow up):  N/A                    Equipment:  Kasandra Knudsen, Single Point  Equipment Size: Adult Standard (Pt height 5'3"-6'4")                                                                                                                                                     INTAKE INFORMATION AND HISTORY:                       Restaurant manager, fast food and PI #:  Kerry Mercado [phone]   Primary Service: (A) Hospital Medicine Faculty Service   Date of Admission: 12/20/2022  Diagnosis: Hyperosmolar hyperglycemic state (HHS) [E11.00]  DM (diabetes mellitus), type 1, uncontrolled, with hyperosmolarity ([place]) [E10.69, E10.65, E87.0]  Vitreous hemorrhage of right eye ([place]) [H43.11]  Hyperkalemia [E87.5]  Vision loss of left eye [H54.62]  Type 1 diabetes mellitus with other ophthalmic complication 0000000   Language: English     Precautions: No Known Precautions/Restrictions  Isolation Precautions: Personal Protective Equipment Utilized: Gloves and Surgical Mask    History of Present Illness/Injury (Including pertinent test results & procedures):    Per MD note 3/23:  "CHIEF COMPLAINT   Blurry vision       HISTORY OF PRESENT ILLNESS  Kerry Mercado is a 57yr old male with history of HTN, HLD, DM2, Hypothyroidism, renal and pancreas transplant 2010 on tacrolimus and  mycophenolate, presents to ED for changes to R eye vision approx 8 days ago. 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 Reports he woke up in the morning and the vision to the R eye was severely decreased. Patient reports a "red fog". He does have limited vision through it and reports he is seeing some spots. Patient does have an extensive ophthalmic history to include diabetic retinopathy status post retinopexy both eyes, neovascular glaucoma and blindness left eye. He denies injury or trauma to the R eye. Endorses he has been unable to  self inject his insulin due to limited vision. Additionally, patient has an extensive diabetic history to include renal transplant in 2011 and pancreatic transplant in 2012. 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  Upon arrival to ED, BG >800mg /dl, pH 7.36, anion gap 13, sodium 125 (corrected sodium 139), sCr 1.22. Labs otherwise reassuring. During his stay in the ED, he was given 25u Lantus and insulin gtt was initiated. POC glucose dropped to 75mg /dl and IVF were changed to D10/0.45%NaCl. Upon examination, patient is resting comfortably in gurney. He denies eye pain, chest pain, shortness of breath, N/V/D.     ...    ASSESSMENT AND PLAN  57yr old male, with history of HTN, HLD, DM2, Hypothyroidism, 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 Work up in ED showed HHS. Not DKA. He was seen by Ophthalmology and confirmed patient has vitreous hemorrhage to R eye      #Hyperglycemia/HHS  #IDDM  Upon arrival to the ED, patients glucose level was >800mg /dl. Patient has been unable to self administer insulin due to vision changes.No evidence of DKA. S/p insulin gtt, lantus, IVF. Patient is currently off insulin gtt at time of admission with POC glucose ranging between 70-110mg /dl. 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  "    Past Medical  History:    Past Medical History:   Diagnosis Date    DM2 (diabetes mellitus, type 2)     HTN (hypertension)     Hyperlipemia     Hypothyroidism        Past Surgical History  No past surgical history on file.       Social History:       PHYSICAL THERAPY EXAMINATION:  RN cleared patient to work with PT.      12/26/22 [id number]   Physical Therapy Time and Intention   Mode of Treatment physical therapy;individual therapy   Total Minutes, Physical Therapy 20   Subjective Pt agreeable to therapy. Pleasant and cooperative. States he gets cranky when his sugar gets too high. Expressing concerns about inability to take care of needs due to worsening vision.   Pain   Comment, Pain Denies any pain   General Information   Existing Precautions/Restrictions no known precautions/restrictions   Living Environment   Comments, Living Lives in trailer with 4 STE. Alone.   Prior Level of Function   Prior Level of Function  Modified independent for ambulation;Modified independent for ADLs;Independent with IADL's   Comment, Prior Level of Function ModI for ambulation. L eye blind. Needs assists with chores and errands from sister who lives an hour away.   Home Use of Assistive/Adaptive Equipment   Home Equipment Type N/A   Equipment Currently Used at Home none   Limitation/Impairments   Limitations/Impairments visual   Comment, Limitation/Impairments L eye blind chronically   Falls   History of Falls in the last 6 months no   Observation   General Observations of Patient supine in bed, hospital gown and pants on.   Vitals Comments   Comment, Vitals WFL   Cognition   Cognitive Function WFL   Edema   Comment, Edema no edema noted   Range of Motion (ROM)   RUE WFL   LUE WFL   RLE WFL   LLE WFL   Manual Muscle Testing   RUE WFL   LUE [facility]  RLE WFL   LLE WFL   Motor Assessment   Comment, Vision able to read PT's badge ~5-6 inches away   Bed Mobility   All Activities, Independence (Bed Mobility) independent   Transfers   Transfers sit-stand  transfer;stand-sit transfer   Sit-Stand Independence (Transfers) independent   Stand-Sit Independence (Transfers) independent   Assistive Device Size (Transfer) N/A   Assistive Device (Transfers) no assistive device used   Comment, (Transfers) Steady upon standing.   Gait Assessment   Gait Locomotion gait/ambulation independence;gait/ambulation assistive device;distance ambulated;gait pattern;gait deviations   Independence Level (Gait) modified independence   Assistive Device Size (Gait) N/A   Assistive Device (Gait) no assistive device used   Distance in Feet (Gait) 350   Pattern (Gait) step-through   Comment, Gait Step-through, continuous steps. Pt feels around environment due to worsening R eye vision, can see shapes. Not open to use of cane or walking stick.   Stairs Assessment   Negotiation (Stairs) stairs independence;stairs assistive device;number of steps   Independence Level (Stairs) independent   Assistive Device Size (Stairs) N/A   Assistive Device (Stairs) no assistive device used   Number of Steps (Stairs) 4   Comment, Stairs Up/down step 4 times without any difficulty.   Activity Tolerance   Activity Tolerance WFL   Discharge Milestones   No PT Barriers for Identified Discharge Disposition True         No additional interventions beyond this evaluation were provided to the patient at this time.    ---Education intervention listed below PT assessment---        Patient Status after PT session: Returned to bed, call bell at side, all needs met. RN notified.       ASSESSMENT / RECOMMENDATIONS:    Physical Therapy Assessment:  The patient is a 57yr old male who admitted to [facility] for hyperglycemia.   He presents with the following problems: vision impairment.  This patient will benefit from PT services at this time to address these problems, so that they may  maximize their recovery of safe functional mobility and independence. Patient is ModI-IND for mobility, will keep on caseload in order to facilitate safe discharge.    Other Consults Requested: N/A      Patient / Caregiver Education Today:   Physical Therapy Plan of Care/Consent & Recommendations (listed above)      Method of Teaching: Verbal    Learner: Patient    Response: Limited understanding--Needs reinforcement    GOALS / TREATMENT PLAN / FUNCTIONAL PROGNOSIS:  Patient's Goals: "My priority is my vision"    Physical Therapy Goals:    Transfers: Maintain IND with mobility   Gait: Maintain ModI with LRAD for community mobility distances     Prognosis:   Good for stated goals  Barriers that may interfere with PT POC: None      Treatment Plan:    Transfer Training  Progressive Gait Training  Discharge Planning  Durable Medical Equipment Recommendations     Recommended Frequency of Treatment: 1-3 days/week    Recommended Duration of Treatment: 2 Weeks, then re-assess.    Interim Report Due:  01/09/2023       Patient's Clinical Presentation: Evolving with changing characteristics  The components of this evaluation necessitated a Moderate complexity level of clinical decision making.     Reported by:    Elsie Stain, PT, DPT  Physical Therapist 1  PI # [phone]

## 2022-12-26 NOTE — Progress Notes (Signed)
Received message from nursing staff that pt upset b/c had paper tray and was refusing to eat after receiving insulin.   Called Vera (bedside nurse) pt was agreeable to have glucose tabs and juice as glucose dropped to 80 and had 10 unit(s) insulin without eating.   Later, while on phone w/ vera, pt agreed to eat dinner.   Asked vera to reach out to MD if pt does not end up eating dinner, as we may need to monitor glucose more frequently.     Mayford Knife, MD   12/26/22 1820

## 2022-12-26 NOTE — Progress Notes (Signed)
INTERNAL MEDICINE DAILY PROGRESS NOTE    Date: 12/26/2022 Time: 15:29    CHIEF COMPLAINT  Blurry vision     INTERVAL  -presented to ED w/ blury vision and inability to take insulin   - found to be in HHS.   - labile blood sugars; IGT consulted   - of note, sugars have dropped to 70s after being up in the 300s. Sugars very labile over course of stay.  - did require IV insulin gtt overnight during hospitalization x 1 for elevated glucose.      SUBJECTIVE  -no acute overnight events  - pt feels well today. Was able to talk w/ nursing supervisor.   - he feels he should be dced to a facility as he does not feel his sister can care for him.     OBJECTIVE    Vitals Signs  Current Vitals  Temp: 36.2 C (97.2 F)  BP: 119/76  Pulse: 88  Resp: 18  SpO2: 100 %     Weight: 73.9 kg (163 lb)    Intake and Output  Last Two Completed Shifts  In: 3080 [Oral:3060; Crystalloid:20]  Out: 1 [Stool:1]    Current Shift  In: 400 [Oral:400]  Out: -     PHYSICAL EXAM     Physical Exam  Constitutional:       Comments: Resting in bed. Appears comfortable.    HENT:      Head: Normocephalic and atraumatic.   Eyes:      General: No scleral icterus.        Right eye: No discharge.         Left eye: No discharge.      Extraocular Movements: Extraocular movements intact.      Conjunctiva/sclera: Conjunctivae normal.   Cardiovascular:      Rate and Rhythm: Normal rate and regular rhythm.      Heart sounds: No murmur heard.     No friction rub. No gallop.   Pulmonary:      Effort: Pulmonary effort is normal. No respiratory distress.      Breath sounds: No stridor. No wheezing, rhonchi or rales.   Abdominal:      General: There is no distension.      Palpations: Abdomen is soft.      Tenderness: There is no abdominal tenderness. There is no guarding.   Musculoskeletal:      Right lower leg: No edema.      Left lower leg: No edema.   Skin:     General: Skin is warm and dry.      Coloration: Skin is not jaundiced.      Comments: No rash to exposed  skin   Neurological:      Mental Status: He is alert.      Comments: Moving all extremities spontaneously. Alert and oriented to self and situation.          Labs and Studies  I have reviewed the following information from the last 24 hours: allied health and treating physician notes, imaging, and labs and microbiology data          Lab Results - 48 hours (excluding micro and POC)   POC GLUCOSE PRN     Status: Abnormal   Result Value Status    POC GLUCOSE 488 (H) Final   POC GLUCOSE Q1H     Status: Abnormal   Result Value Status    POC GLUCOSE 317 (H) Final   Basic Metabolic Panel  Status: Abnormal   Result Value Status    Sodium 134 (L) Final    Potassium 4.0 Final    Chloride 98 Final    Carbon Dioxide Total 23 Final    Anion Gap 13 Final    Urea Nitrogen, Blood (BUN) 27 (H) Final    Creatinine Serum 1.21 (H) Final    Glucose 362 (H) Final    Calcium 9.8 Final    E-GFR Creatinine (Male) 35 Final   Beta-Hydroxybutyrate     Status: Normal   Result Value Status    Beta-Hydroxybutyrate 0.12 Final   POC GLUCOSE Q1H     Status: Abnormal   Result Value Status    POC GLUCOSE 362 (H) Final   POC GLUCOSE PRN     Status: Abnormal   Result Value Status    POC GLUCOSE 369 (H) Final   POC GLUCOSE Q1H     Status: Abnormal   Result Value Status    POC GLUCOSE 347 (H) Final   POC GLUCOSE PRN     Status: Abnormal   Result Value Status    POC GLUCOSE 212 (H) Final   POC GLUCOSE Q1H     Status: Abnormal   Result Value Status    POC GLUCOSE 208 (H) Final   POC GLUCOSE Q1H     Status: Abnormal   Result Value Status    POC GLUCOSE 115 (H) Final   POC GLUCOSE Q1H     Status: Abnormal   Result Value Status    POC GLUCOSE 159 (H) Final   POC GLUCOSE Q1H     Status: Abnormal   Result Value Status    POC GLUCOSE 140 (H) Final   POC GLUCOSE Q1H     Status: None   Result Value Status    POC GLUCOSE 99 Final   POC GLUCOSE PRN     Status: Abnormal   Result Value Status    POC GLUCOSE 104 (H) Final   POC GLUCOSE QAM     Status: Abnormal    Result Value Status    POC GLUCOSE 181 (H) Final   Magnesium (Mg)     Status: Normal   Result Value Status    Magnesium (Mg) 1.8 Final   Basic Metabolic Panel     Status: Abnormal   Result Value Status    Sodium 136 Final    Potassium 3.6 Final    Chloride 99 Final    Carbon Dioxide Total 24 Final    Anion Gap 13 Final    Urea Nitrogen, Blood (BUN) 25 (H) Final    Creatinine Serum 1.06 Final    Glucose 213 (H) Final    Calcium 9.1 Final    E-GFR Creatinine (Male) 81 Final   Tacrolimus Trough (FK506)     Status: Abnormal   Result Value Status    Tacrolimus (FK506) 3.6 (L) Final   POC GLUCOSE Q1H     Status: Abnormal   Result Value Status    POC GLUCOSE 217 (H) Final   POC GLUCOSE Q1H     Status: Abnormal   Result Value Status    POC GLUCOSE 215 (H) Final   POC GLUCOSE PRN     Status: Abnormal   Result Value Status    POC GLUCOSE 204 (H) Final   Hemoglobin A1C     Status: Abnormal   Result Value Status    Hgb A1C 12.6 (H) Final    Hgb A1C,Glucose Est Avg 315  Final   POC GLUCOSE PRN     Status: Abnormal   Result Value Status    POC GLUCOSE 107 (H) Final   POC GLUCOSE PRN     Status: Abnormal   Result Value Status    POC GLUCOSE 129 (H) Final   POC GLUCOSE PRN     Status: Abnormal   Result Value Status    POC GLUCOSE 60 (L) Final   POC GLUCOSE PRN     Status: Abnormal   Result Value Status    POC GLUCOSE 51 (Crtl) Final   POC GLUCOSE PRN     Status: Abnormal   Result Value Status    POC GLUCOSE 69 (L) Final   Basic Metabolic Panel     Status: Abnormal   Result Value Status    Sodium 139 Final    Potassium 3.6 Final    Chloride 101 Final    Carbon Dioxide Total 24 Final    Anion Gap 14 Final    Urea Nitrogen, Blood (BUN) 23 (H) Final    Creatinine Serum 1.10 Final    Glucose 74 Final    Calcium 9.7 Final    E-GFR Creatinine (Male) 78 Final   POC GLUCOSE PRN     Status: Abnormal   Result Value Status    POC GLUCOSE 122 (H) Final   POC GLUCOSE PRN     Status: Abnormal   Result Value Status    POC GLUCOSE 270 (H) Final    POC GLUCOSE PRN     Status: Abnormal   Result Value Status    POC GLUCOSE 277 (H) Final   POC GLUCOSE PRN     Status: Abnormal   Result Value Status    POC GLUCOSE 256 (H) Final   Magnesium (Mg)     Status: Normal   Result Value Status    Magnesium (Mg) 1.7 Final   Tacrolimus Trough (FK506)     Status: Normal   Result Value Status    Tacrolimus (FK506) 7.4 Final   Basic Metabolic Panel     Status: Abnormal   Result Value Status    Sodium 136 Final    Potassium 4.0 Final    Chloride 99 Final    Carbon Dioxide Total 25 Final    Anion Gap 12 Final    Urea Nitrogen, Blood (BUN) 24 (H) Final    Creatinine Serum 1.01 Final    Glucose 276 (H) Final    Calcium 9.1 Final    E-GFR Creatinine (Male) 86 Final   POC GLUCOSE PRN     Status: Abnormal   Result Value Status    POC GLUCOSE 294 (H) Final   POC GLUCOSE PRN     Status: Abnormal   Result Value Status    POC GLUCOSE 202 (H) Final   POC GLUCOSE PRN     Status: None   Result Value Status    POC GLUCOSE 88 Final     IMPRESSION:  1. Transplant renal artery stenosis screening risk category: Low. See  management recommendations below.    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 Work up in ED showed HHS. Not DKA. He was seen by Ophthalmology and confirmed patient has vitreous hemorrhage to R eye      Hyperglycemia/HHS  Type 1 DM,  poorly controlled w/ complications of nephropathy and retinopathy.   > Upon arrival to the ED, patients glucose level was >800mg /dl. Patient has been unable to self administer insulin due to vision changes.No evidence of DKA. S/p insulin gtt, lantus, IVF. Patient is currently off insulin gtt at time of admission with POC glucose ranging between 70-110mg /dl. 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    > hgb A1c 12.7  > on insulin gtt  night of 3/26-3/27. Now improved glucose.   - IGT consulted   - Continue with Basal Insulin Glargine (Lantus) 15  units SQ qBID   - Continue with Nutritional Aspart (Novolog) 10 units TID AC (holding now hypoglycemic)   - cont sensitive Correctional Aspart (Novolog) Scale SQ w/ meals  - Continue with Snack Coverage Novolog 2 units SQ TID   - home regimen: tresiba 30 unit(s) subQ qHS. Novolog 10 unit(s) TID AC, correctional scale 1 unit novolog for every 50mg /dl above 150mg /dl TID AC, inhaled glucagon.   - should have follow-up w/ PCP and endocrinologist to help get prior auth for CGM.   - Carb controlled diet  - hypoglycemia protocol   - working on getting talking glucometer for DC- likely to be ready 3/29. I called [facility] to confirm they had RX.      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 He describes it as a red fog; he is able to see it through it but his vision is now much more limited. He also sees some spots. Denies flashes, dark curtain/shadow.   -Ophthalmology consulted-- - Dilated exam showed diffusely hazy view  - B-scan ultrasound showed vitreous hemorrhage without retinal detachment   - Most likely etiology is diabetic retinopathy in the setting of uncontrolled diabetes  - Recommend minimal activity, sleep with head of bed elevated, avoid non medically necessary blood thinners and NSAIDs  - Return precautions discussed and emergency line reiterated  - Upon discharge, patient should follow-up with the Retina Service in 1 week-referral placed. Ophtho aware DC likely this week.    - Recommend lensometer reading of spectacles at that time to determine degree of myopia. Once vit heme has resolved should establish care with our retina service     AKI. Resolved.   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 Likely secondary to HHS. Unknown baseline. Current sCr 0.92. Of note, patient is s/p kidney transplant in 2011.   > tacrolimus level 3/23 < 2.0. now 4.3. suspect possible non compliance.   > transplant kidney US w/ low risk of RAS  > no prednisone per transplant.   -Monitor daily BMP  -glucose correction as noted as above   - increase tacro to 2mg  BID as tacro low   - con thome cellcept 750 BID  - follow-up tacrolimus level tomorrow   - tranplant nephro consulted. - cont tacro as above.                 HLD  Continue atorvastatin      Hypothyroidism  > TSH within normal limits   Continue levothyroxine     HTN  Continue PTA lisinopril     FEN: DIABETIC DIET - MALE  SPECIAL TRAY REQUEST  SPECIAL TRAY REQUEST  SPECIAL TRAY REQUEST  SNACKS  SPECIAL TRAY REQUEST  DVT Prophylaxis: SCD in setting of vitreous hemorrhage.   Advanced Care Planning/Goals of Care: Full code   Disposition: pending PT/OT eval    Total time I spent in care of this patient today (excluding time spent on other billable services) was 40 minutes. This time does not overlap with other providers involved in this patient's care.       Report Electronically Signed by: Mayford Knife, [facility] Medicine Faculty Service

## 2022-12-26 NOTE — Nurse Assessment (Signed)
ASSESSMENT NOTE    Note Started: 12/26/2022, 0820     Assumed care, pt resting in bed. A&Ox4. Initial assessment completed and recorded in EMR.  Report received from night shift nurse and orders reviewed. Plan of Care reviewed and appropriate, discussed with patient.  Stephannie Peters, RN

## 2022-12-26 NOTE — Nurse Assessment (Signed)
ASSESSMENT NOTE    Note Started: 12/26/2022, 1905     Initial assessment completed and recorded in EMR.  Report received from day shift nurse and orders reviewed. Plan of Care reviewed and updated, discussed with patient.  Linwood Dibbles, RN

## 2022-12-26 NOTE — Discharge Instructions (Signed)
Given that the patient is pending discharge within the next few days, patient should follow up with the Retina service early next week for ophthalmic imaging and evaluation by the Retina team. Staff messaged clinic schedulers and instructed the team to reach patient via patient's sister. If any questions about scheduling, patient and family can call the eye center at [phone].

## 2022-12-27 NOTE — Discharge Summary (Signed)
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 However pt left before plan could be finalized   ----------------------------------------------------------------------    Incidental Findings that need PCP Follow-up:  N/a  N/a  -----------------------------------------------------------------------    PENDING STUDIES AT DISCHARGE:  N/a    PROCEDURES/SURGERIES PERFORMED DURING THIS ADMISSION:  N/a    CONSULTATIONS MADE DURING THIS ADMISSION:    OPHTHALMOLOGY CONSULT  ED HEALTH NAVIGATOR CONSULT  INPATIENT GLYCEMIC TEAM (ENDOCRINOLOGY) CONSULT  SOCIAL SERVICES/ SOCIAL WORKER CONSULT    Kerry Mercado MEDICATION LIST ON DISCHARGE:   Discharge Medication List as of 12/21/2022  1:48 PM        CONTINUE these medications which have NOT CHANGED    Details   Aspirin 81 mg Chewable Tablet Take 1 tablet by mouth every day., Long-term, Historical      BASAGLAR KWIKPEN U-100 INSULIN 100 unit/mL (3 mL) Pen Inject 20 Units subcutaneously every day at bedtime., DAW, Historical      LevoTHYROxine 75 mcg Tablet  Take 1 milli-units by mouth every morning before a meal., Long-term, Historical      Mycophenolate (MMF) 250 mg Capsule Take 3 capsules by mouth 2 times daily., 750 mg, Long-term, Historical      Omeprazole (PRILOSEC) 40 mg Delayed Release Capsule Take 1 capsule by mouth once daily before a meal., 40 mg, Long-term, Historical      PROZAC 40 mg Capsule Take 1 capsule by mouth every day., DAW, Long-term, Historical      Tacrolimus (FK-506) 1 mg Capsule Take 1 capsule by mouth 2 times daily., Long-term, Historical      Atorvastatin (LIPITOR) 20 mg Tablet Take 1 tablet by mouth every day., Long-term, Historical      Lisinopril (PRINIVIL, ZESTRIL) 5 mg Tablet Take 1 tablet by mouth every day., 5 mg, Long-term, Historical               BRIEF HISTORY OF PRESENT ILLNESS:   Kerry Mercado is a 57yr old male with history of HTN, HLD, DM2, Hypothyroidism, 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     In terms of vision, Kerry Mercado had worsening right eye vision leading up to hospitaliation. He was seen by ophthalmology who noted vitreous hemorrhage. Plan was outpatient follow-up with anti-VEGF injection and/or  laer which could only be done outpatient. Ophtho aware pt was going to be discharged soon and had plans to schedule on DC, however pt eloped.     In terms of diabetes, he was noted initially to have glucose in the 900s, which improved with insulin administration. However, throughout course he was found to have verylabile blood sugars, several times going from very high to almost hypoglycemic. Inpatient glycemic team consulted who aided in controlling sugars. Ultimately plan was for tresiba 30u subQ and novolog 10 TID AC w/ sliding scale, but pt eloped. We had also worked on getting a talking glucometer, which was sent to his home pharmacy but had not been delivered yet to the pharmacy.      Of note, pt was still in hospital working on dispo, as w/ worsening vision there was concern about his diabetic medications. He was agreeable to going to facility if indicated, but he left before placement.     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 Work up in ED showed HHS. Not DKA. He was seen by Ophthalmology and confirmed Kerry Mercado has vitreous hemorrhage to R eye      Hyperglycemia/HHS  Type 1 DM, poorly controlled w/ complications of nephropathy and retinopathy.   > Upon arrival to the ED, patients glucose level was >800mg /dl. Kerry Mercado has been unable to self administer insulin due to vision changes.No evidence of DKA. S/p insulin gtt, lantus, IVF. Kerry Mercado is currently off insulin gtt at time of admission with POC glucose ranging between 70-110mg /dl. 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    > hgb A1c 12.7  > on insulin gtt night of 3/26-3/27. Now improved glucose.   - IGT consulted   - Continue with Basal Insulin Glargine (Lantus) 15  units SQ qBID   - Continue with Nutritional Aspart (Novolog) 10 units TID AC (holding now hypoglycemic)   - cont sensitive Correctional Aspart (Novolog) Scale SQ w/ meals  - Continue with Snack Coverage Novolog 2 units SQ TID   - home regimen: tresiba 30 unit(s) subQ qHS. Novolog 10 unit(s) TID AC, correctional scale 1 unit novolog for every 50mg /dl above 150mg /dl TID AC, inhaled glucagon.   - should have follow-up w/ PCP and endocrinologist to help get prior auth for CGM.   - Carb controlled diet  - hypoglycemia protocol   - working on getting talking glucometer for DC- likely to be ready 3/29. 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 He describes it as a red fog; he is able to see it through it but his vision is now much more limited. He also sees some spots. Denies flashes, dark curtain/shadow.   -Ophthalmology consulted-- - Dilated exam showed diffusely hazy view  - B-scan ultrasound showed vitreous hemorrhage without retinal detachment   - Most likely etiology is diabetic retinopathy in the setting of uncontrolled diabetes  - Recommend minimal activity, sleep with head of bed elevated, avoid non medically  necessary blood thinners and NSAIDs  - Return precautions discussed and emergency line reiterated  - Upon discharge, Kerry Mercado should follow-up with the Retina Service in 1 week-referral placed. Ophtho aware DC likely this week.    - Recommend lensometer reading of spectacles at that time to determine degree of myopia. Once vit heme has resolved should establish care with our retina service      AKI. Resolved.   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 Likely secondary to HHS. Unknown baseline. Current sCr 0.92. Of note, Kerry Mercado is s/p kidney transplant in 2011.   > tacrolimus level 3/23 < 2.0. now 4.3. suspect possible non compliance.   > transplant kidney US w/ low risk of RAS  > no prednisone per transplant.   -Monitor daily BMP  -glucose correction as noted as above   - increase tacro to 2mg  BID as tacro low   - con thome cellcept 750 BID  - follow-up tacrolimus level tomorrow   - tranplant nephro consulted. - cont tacro as above.                 HLD  Continue atorvastatin      Hypothyroidism  > TSH within normal limits   Continue levothyroxine     HTN  Continue PTA lisinopril       Kerry Mercado CONDITION AT DISCHARGE  PHYSICAL EXAM:  Vitals: BP 134/66   Pulse 91    Temp 36.9 C (98.4 F) (Oral)   Resp 18   Ht 1.829 m (6')   Wt 73.9 kg (163 lb)   SpO2 99%   BMI 22.11 kg/m    Physical Exam  No exam as pt eloped     Pertinent Lab, Study, and Image Findings    CBC:  BMP:   No results for input(s): "WBC", "HGB", "PLT" in the last 72 hours. Recent labs for the past 72 hours     12/25/22  0516 12/25/22  1424 12/26/22  0506   NA 136 139 136   K 3.6 3.6 4.0   CL 99 101 99   CO2 24 24 25    BUN 25* 23* 24*   CR 1.06 1.10 1.01   GLU 213* 74 276*        Pertinent Imaging:   Renal US 12/22/22  IMPRESSION:  1. Transplant renal artery stenosis screening risk category: Low. See  management recommendations below.     Micro:   Microbiology Results (In The Last 14 Days)       Collected      12/21/2022 2354 Component Value   CULTURE SURVEILLANCE, MRSA No MRSA isolated                     DISCHARGE INSTRUCTIONS:  No discharge procedures on file.     No discharge procedures on file.     No discharge procedures on file.      MEDICAL FOLLOW-UP CARE PLAN:  Kerry Mercado advised to followup with Primary Care Provider, Specialist as instructed in discharge orders.  Followup appointments: No future appointments.  Recommended Follow Up Appointments: No follow-up provider specified.   Referral Orders (From admission, onward)           Ordered     ENDOCRINOLOGY REFERRAL         12/24/22 [facility] Referral  12/21/22 0000                    Kerry Mercado advised to followup sooner with any change in condition or new symptoms.  Instructions/Communication also given to Kerry Mercado's family members and primary care provider.    Total Time Spent on discharge including counseling, preparation, and followup arrangements: 5 minutes     Electronically signed by:  Mayford Knife, MD  Department of Internal Medicine  [place] CC to:    Soe, Than     Additional CC to:  --   --

## 2022-12-27 NOTE — Nurse Focus (Signed)
Patient remain calm and cooperative this shift, no outburst noted, he was compliant with meds, and blood sugar check,complain of hips pain, received tylenol, later he verbalized relief and went to sleep, shortly after he woke up remain calm and resting in his bed.  Hourly round were maintained.  At [place] after one hour and 15 min, found patient was not in the room, left with with two IV access on right AC and Right Arm, MD and hospital supervisor was notified by the charge RN Lattie Haw.   Per protocol after two hours patient did not return and Dr Bettey Mare.  At [place] patient discharged from the system.

## 2022-12-27 NOTE — Progress Notes (Signed)
Brief Xcover Note:     Informed by RN that patient has not been seen in bed for 2 hours, unclear location. Left with PIV still in place per RN. At this time per protocol patient discharged due to elopement.     Lucy Chris (Christine) Oran Rein  Internal Medicine Resident, PGY-3  PI # [id number]  12/27/2022, 1:23 AM

## 2022-12-29 ENCOUNTER — Other Ambulatory Visit: Payer: Self-pay

## 2022-12-30 ENCOUNTER — Other Ambulatory Visit: Payer: Self-pay

## 2022-12-30 ENCOUNTER — Telehealth: Payer: Self-pay | Admitting: Internal Medicine

## 2022-12-30 NOTE — Telephone Encounter (Signed)
ER or Hospital Follow Up:        Left voicemail to schedule patient in ENDO.  If patient calls back, please schedule  With Next available     Scheduling instructions:     Reason for Referral:   Diabetes/Glucose Disorders [2]   Primary Indication:   Diabetes [13]   How soon after discharge does this patient need to be seen?   Next available        Thank you,     Ardine Bjork, [place]  Patient Kerry Mercado

## 2022-12-31 NOTE — Telephone Encounter (Signed)
Called patient who stated that he is now in [place] so he will follow up with his PCP Dr. Sherrin Daisy and have his PCP refer him to ENDO.     Patient informed that he can call back to schedule with ENDO at [facility] if he can not get a referral from PCP    Ardine Bjork, [facility]  Patient [place]
